# Patient Record
Sex: Female | Born: 1980 | Hispanic: No | Marital: Married | Smoking: Never smoker
Health system: Southern US, Community
[De-identification: ages and names within clinical notes are randomized; demographics above are authoritative.]

## PROBLEM LIST (undated history)

## (undated) DIAGNOSIS — A150 Tuberculosis of lung: Secondary | ICD-10-CM

## (undated) DIAGNOSIS — R51 Headache: Secondary | ICD-10-CM

## (undated) HISTORY — PX: NO PAST SURGERIES: SHX2092

---

## 2009-07-21 DIAGNOSIS — A150 Tuberculosis of lung: Secondary | ICD-10-CM

## 2009-07-21 HISTORY — DX: Tuberculosis of lung: A15.0

## 2009-08-08 LAB — SICKLE CELL SCREEN: Sickle Cell Screen: NORMAL

## 2009-08-08 LAB — OB RESULTS CONSOLE VARICELLA ZOSTER ANTIBODY, IGG: VARICELLA IGG: IMMUNE

## 2009-08-10 ENCOUNTER — Encounter: Admission: RE | Admit: 2009-08-10 | Discharge: 2009-08-10 | Payer: Self-pay | Admitting: Infectious Diseases

## 2009-08-16 ENCOUNTER — Ambulatory Visit: Payer: Self-pay | Admitting: Obstetrics & Gynecology

## 2009-09-10 LAB — TB SKIN TEST
Induration: 11 mm
TB Skin Test: POSITIVE

## 2009-10-24 ENCOUNTER — Ambulatory Visit: Payer: Self-pay | Admitting: Obstetrics and Gynecology

## 2010-01-10 ENCOUNTER — Ambulatory Visit: Payer: Self-pay | Admitting: Obstetrics and Gynecology

## 2010-03-29 ENCOUNTER — Ambulatory Visit: Payer: Self-pay | Admitting: Obstetrics & Gynecology

## 2010-06-17 ENCOUNTER — Ambulatory Visit: Payer: Self-pay | Admitting: Obstetrics & Gynecology

## 2010-09-02 ENCOUNTER — Ambulatory Visit: Payer: Self-pay

## 2010-09-30 ENCOUNTER — Inpatient Hospital Stay (INDEPENDENT_AMBULATORY_CARE_PROVIDER_SITE_OTHER)
Admission: RE | Admit: 2010-09-30 | Discharge: 2010-09-30 | Disposition: A | Discharge status: Home / Self Care | Payer: Medicaid Other | Source: Ambulatory Visit | Attending: Family Medicine | Admitting: Family Medicine

## 2010-09-30 DIAGNOSIS — J40 Bronchitis, not specified as acute or chronic: Secondary | ICD-10-CM

## 2011-05-12 ENCOUNTER — Inpatient Hospital Stay (INDEPENDENT_AMBULATORY_CARE_PROVIDER_SITE_OTHER)
Admission: RE | Admit: 2011-05-12 | Discharge: 2011-05-12 | Disposition: A | Discharge status: Home / Self Care | Payer: Self-pay | Source: Ambulatory Visit | Attending: Emergency Medicine | Admitting: Emergency Medicine

## 2011-05-12 DIAGNOSIS — K219 Gastro-esophageal reflux disease without esophagitis: Secondary | ICD-10-CM

## 2011-05-12 DIAGNOSIS — Z331 Pregnant state, incidental: Secondary | ICD-10-CM

## 2011-05-12 LAB — POCT URINALYSIS DIP (DEVICE)
Hgb urine dipstick: NEGATIVE
Ketones, ur: NEGATIVE mg/dL
Protein, ur: NEGATIVE mg/dL
Specific Gravity, Urine: 1.01 (ref 1.005–1.030)
Urobilinogen, UA: 0.2 mg/dL (ref 0.0–1.0)
pH: 7 (ref 5.0–8.0)

## 2011-05-23 ENCOUNTER — Emergency Department (HOSPITAL_COMMUNITY)
Admission: EM | Admit: 2011-05-23 | Discharge: 2011-05-24 | Disposition: A | Discharge status: Home / Self Care | Payer: Medicaid Other | Attending: Emergency Medicine | Admitting: Emergency Medicine

## 2011-05-23 ENCOUNTER — Emergency Department (HOSPITAL_COMMUNITY): Payer: Medicaid Other

## 2011-05-23 DIAGNOSIS — O99891 Other specified diseases and conditions complicating pregnancy: Secondary | ICD-10-CM | POA: Insufficient documentation

## 2011-05-23 DIAGNOSIS — R112 Nausea with vomiting, unspecified: Secondary | ICD-10-CM | POA: Insufficient documentation

## 2011-05-23 LAB — COMPREHENSIVE METABOLIC PANEL
Alkaline Phosphatase: 56 U/L (ref 39–117)
BUN: 13 mg/dL (ref 6–23)
Creatinine, Ser: 0.63 mg/dL (ref 0.50–1.10)
GFR calc Af Amer: >90 mL/min (ref >90)
Glucose, Bld: 86 mg/dL (ref 70–99)
Potassium: 3.6 mEq/L (ref 3.5–5.1)
Total Bilirubin: 0.2 mg/dL — ABNORMAL LOW (ref 0.3–1.2)
Total Protein: 7.3 g/dL (ref 6.0–8.3)

## 2011-05-23 LAB — URINE MICROSCOPIC-ADD ON

## 2011-05-23 LAB — DIFFERENTIAL
Basophils Absolute: 0 10*3/uL (ref 0.0–0.1)
Basophils Relative: 0 % (ref 0–1)
Neutro Abs: 6.9 10*3/uL (ref 1.7–7.7)
Neutrophils Relative %: 67 % (ref 43–77)

## 2011-05-23 LAB — POCT PREGNANCY, URINE: Preg Test, Ur: POSITIVE

## 2011-05-23 LAB — HCG, QUANTITATIVE, PREGNANCY: hCG, Beta Chain, Quant, S: 210022 m[IU]/mL — ABNORMAL HIGH (ref <5)

## 2011-05-23 LAB — CBC
HCT: 34.9 % — ABNORMAL LOW (ref 36.0–46.0)
Hemoglobin: 12.7 g/dL (ref 12.0–15.0)
MCHC: 36.4 g/dL — ABNORMAL HIGH (ref 30.0–36.0)
Platelets: 273 10*3/uL (ref 150–400)
RBC: 4.21 MIL/uL (ref 3.87–5.11)

## 2011-05-23 LAB — URINALYSIS, ROUTINE W REFLEX MICROSCOPIC
Glucose, UA: NEGATIVE mg/dL
Ketones, ur: NEGATIVE mg/dL
Nitrite: NEGATIVE
Protein, ur: NEGATIVE mg/dL
Urobilinogen, UA: 0.2 mg/dL (ref 0.0–1.0)

## 2011-05-24 ENCOUNTER — Emergency Department (HOSPITAL_COMMUNITY)
Admission: EM | Admit: 2011-05-24 | Discharge: 2011-05-24 | Disposition: A | Discharge status: Home / Self Care | Payer: Medicaid Other | Attending: Emergency Medicine | Admitting: Emergency Medicine

## 2011-05-24 DIAGNOSIS — R1115 Cyclical vomiting syndrome unrelated to migraine: Secondary | ICD-10-CM | POA: Insufficient documentation

## 2011-05-24 DIAGNOSIS — O9989 Other specified diseases and conditions complicating pregnancy, childbirth and the puerperium: Secondary | ICD-10-CM | POA: Insufficient documentation

## 2011-05-24 LAB — URINALYSIS, ROUTINE W REFLEX MICROSCOPIC
Bilirubin Urine: NEGATIVE
Leukocytes, UA: NEGATIVE
Protein, ur: NEGATIVE mg/dL
Specific Gravity, Urine: 1.014 (ref 1.005–1.030)

## 2011-05-24 LAB — POCT I-STAT, CHEM 8
Creatinine, Ser: 0.7 mg/dL (ref 0.50–1.10)
HCT: 37 % (ref 36.0–46.0)
Hemoglobin: 12.6 g/dL (ref 12.0–15.0)
Potassium: 3.7 mEq/L (ref 3.5–5.1)
Sodium: 139 mEq/L (ref 135–145)
TCO2: 22 mmol/L (ref 0–100)

## 2011-05-24 LAB — WET PREP, GENITAL
Clue Cells Wet Prep HPF POC: NONE SEEN
Trich, Wet Prep: NONE SEEN

## 2011-05-24 LAB — GC/CHLAMYDIA PROBE AMP, GENITAL: Chlamydia, DNA Probe: NEGATIVE

## 2011-06-02 ENCOUNTER — Encounter: Payer: Self-pay | Admitting: *Deleted

## 2011-06-02 ENCOUNTER — Emergency Department (INDEPENDENT_AMBULATORY_CARE_PROVIDER_SITE_OTHER)
Admission: EM | Admit: 2011-06-02 | Discharge: 2011-06-02 | Disposition: A | Discharge status: Home / Self Care | Payer: Medicaid Other | Source: Home / Self Care

## 2011-06-02 DIAGNOSIS — IMO0002 Reserved for concepts with insufficient information to code with codable children: Secondary | ICD-10-CM

## 2011-06-02 DIAGNOSIS — O219 Vomiting of pregnancy, unspecified: Secondary | ICD-10-CM

## 2011-06-02 DIAGNOSIS — K5900 Constipation, unspecified: Secondary | ICD-10-CM

## 2011-06-02 HISTORY — DX: Tuberculosis of lung: A15.0

## 2011-06-02 LAB — POCT URINALYSIS DIP (DEVICE)
Protein, ur: NEGATIVE mg/dL
Specific Gravity, Urine: >=1.03 (ref 1.005–1.030)
Urobilinogen, UA: 0.2 mg/dL (ref 0.0–1.0)
pH: 5.5 (ref 5.0–8.0)

## 2011-06-02 MED ORDER — ONDANSETRON 4 MG PO TBDP
8.0000 mg | ORAL_TABLET | Freq: Once | ORAL | Status: AC
  Administered 2011-06-02: 8 mg via ORAL

## 2011-06-02 NOTE — ED Notes (Signed)
Pt is here with husband and interpreter.  Reports pt is pregnant.  Pt was seen in [facility] ED 11/3 and diagnosed with nausea and vomiting and given Rx for zofran.  Pt continues to report nausea (without vomiting), HA, and epigastric burning.

## 2011-06-02 NOTE — ED Provider Notes (Signed)
History     CSN: [id number] Arrival date & time: 06/02/2011  7:37 PM   First MD Initiated Contact with Patient 06/02/11 1959      Chief Complaint  Patient presents with  . Morning Sickness    (Consider location/radiation/quality/duration/timing/severity/associated sxs/prior treatment) Patient is a 30 y.o. female presenting with GI illlness. The history is provided by the patient and the spouse. The history is limited by a language barrier. A language interpreter was used (brought own interpreter).  GI Problem  This is a new (over 1 week) problem. Episode onset: Reports 1 month pregnancy but can not give a LMP has been experiencing nausea with vomitting last week, during last weekonly nausea but no vomitting has been seen twice this week for same complaint  has zofran prescritption at home but no taking. There has been no fever. Pertinent negatives include no abdominal pain, no vomiting, no chills, no sweats, no headaches, no arthralgias, no myalgias, no URI and no cough. Associated symptoms comments: Constipation, last bowel movement 5 days ago passing gas, denies abdominal pain. No vomiting but feeling sick in her stomach. No dysuria, no vaginal bleeding or discharge.. Treatments tried: had a prescripton for zofran but no taking as husband thinks it makes her feel weak. No stablished OB yet.    Past Medical History  Diagnosis Date  . TB (pulmonary tuberculosis)     Pt was treated for 8 months    History reviewed. No pertinent past surgical history.  History reviewed. No pertinent family history.  History  Substance Use Topics  . Smoking status: Never Smoker   . Smokeless tobacco: Not on file  . Alcohol Use: No    OB History    Grav Para Term Preterm Abortions TAB SAB Ect Mult Living   7 6              Review of Systems  Constitutional: Negative for chills.  Respiratory: Negative for cough.   Gastrointestinal: Negative for vomiting and abdominal pain.  Genitourinary:  Negative for dysuria, hematuria, flank pain, vaginal bleeding, vaginal discharge, difficulty urinating and pelvic pain.  Musculoskeletal: Negative for myalgias and arthralgias.  Neurological: Negative for headaches.    Allergies  Review of patient's allergies indicates no known allergies.  Home Medications   Current Outpatient Rx  Name Route Sig Dispense Refill  . PRENATAL 27-0.8 MG PO TABS Oral Take 1 tablet by mouth daily.        BP 100/68  Pulse 68  Temp(Src) 98.7 F (37.1 C) (Oral)  Resp 14  SpO2 100%  LMP 05/02/2011  Physical Exam  Constitutional: She is oriented to person, place, and time. She appears well-developed and well-nourished.       Spitting frequently.  HENT:  Mouth/Throat: Oropharynx is clear and moist.       Moist mucus membranes  Eyes: Conjunctivae are normal. Pupils are equal, round, and reactive to light. No scleral icterus.  Cardiovascular: Normal rate, regular rhythm and normal heart sounds.        No tachycardia  Pulmonary/Chest: Effort normal and breath sounds normal.  Abdominal: Soft. Bowel sounds are normal. She exhibits no distension and no mass. There is no tenderness. There is no rebound and no guarding.       Thin flat abdomen, Uterus fundus not palpable  Lymphadenopathy:    She has no cervical adenopathy.  Neurological: She is alert and oriented to person, place, and time.  Skin: No rash noted. No pallor.  Normal cap refill.    ED Course  Procedures (including critical care time)  Labs Reviewed  POCT URINALYSIS DIP (DEVICE) - Abnormal; Notable for the following:    Hgb urine dipstick TRACE (*)    All other components within normal limits  POCT PREGNANCY, URINE  LAB REPORT - SCANNED   No results found.   1. Constipation in pregnancy   2. Nausea and vomiting in pregnancy       MDM  No clinical signs of dehydration, treated symptomatically and counseled for nausea and constipation during pregnancy.        Sharin Grave, MD 06/03/11 1031

## 2011-06-11 LAB — OB RESULTS CONSOLE HEPATITIS B SURFACE ANTIGEN
Hepatitis B Surface Ag: NEGATIVE
Hepatitis B Surface Ag: POSITIVE

## 2011-06-11 LAB — OB RESULTS CONSOLE ABO/RH: RH Type: POSITIVE

## 2011-06-11 LAB — OB RESULTS CONSOLE HIV ANTIBODY (ROUTINE TESTING): HIV: NONREACTIVE

## 2011-06-20 ENCOUNTER — Encounter (HOSPITAL_COMMUNITY): Payer: Self-pay | Admitting: *Deleted

## 2011-06-20 ENCOUNTER — Emergency Department (HOSPITAL_COMMUNITY)
Admission: EM | Admit: 2011-06-20 | Discharge: 2011-06-21 | Disposition: A | Discharge status: Home / Self Care | Payer: Medicaid Other | Attending: Emergency Medicine | Admitting: Emergency Medicine

## 2011-06-20 DIAGNOSIS — Z8611 Personal history of tuberculosis: Secondary | ICD-10-CM | POA: Insufficient documentation

## 2011-06-20 DIAGNOSIS — Z331 Pregnant state, incidental: Secondary | ICD-10-CM | POA: Insufficient documentation

## 2011-06-20 DIAGNOSIS — R1031 Right lower quadrant pain: Secondary | ICD-10-CM | POA: Insufficient documentation

## 2011-06-20 DIAGNOSIS — K921 Melena: Secondary | ICD-10-CM | POA: Insufficient documentation

## 2011-06-20 LAB — DIFFERENTIAL
Basophils Relative: 0 % (ref 0–1)
Lymphocytes Relative: 19 % (ref 12–46)
Lymphs Abs: 1.3 10*3/uL (ref 0.7–4.0)
Monocytes Relative: 7 % (ref 3–12)
Neutro Abs: 5.2 10*3/uL (ref 1.7–7.7)
Neutrophils Relative %: 73 % (ref 43–77)

## 2011-06-20 LAB — URINALYSIS, ROUTINE W REFLEX MICROSCOPIC
Bilirubin Urine: NEGATIVE
Leukocytes, UA: NEGATIVE
Nitrite: NEGATIVE
Specific Gravity, Urine: 1.005 (ref 1.005–1.030)
pH: 7.5 (ref 5.0–8.0)

## 2011-06-20 LAB — CBC
Hemoglobin: 15.1 g/dL — ABNORMAL HIGH (ref 12.0–15.0)
RBC: 5.04 MIL/uL (ref 3.87–5.11)

## 2011-06-20 MED ORDER — ACETAMINOPHEN 325 MG PO TABS
650.0000 mg | ORAL_TABLET | Freq: Once | ORAL | Status: AC
  Administered 2011-06-20: 650 mg via ORAL
  Filled 2011-06-20: qty 2

## 2011-06-20 NOTE — ED Notes (Signed)
Pt pointing to head, per husband states she has headache. Remains on cardiac monitor. Vital signs stable.

## 2011-06-20 NOTE — ED Notes (Signed)
Pt reports being currently pregnant, pt states, "I am nearly 2 mths pregnant. This is my 7th pregnancy. All of my children were full term & natural birth. I am taking maternity vitamins. My doc

## 2011-06-20 NOTE — ED Notes (Signed)
Pt presents to department via [facility] for evaluation of lethargy, failure to thrive, and dizziness. Family member states she hasn't eaten in over x1 month. Pt is difficult to arouse and lethargic upon arrival.

## 2011-06-21 LAB — BASIC METABOLIC PANEL
BUN: 8 mg/dL (ref 6–23)
Chloride: 103 mEq/L (ref 96–112)
GFR calc Af Amer: >90 mL/min (ref >90)
Glucose, Bld: 100 mg/dL — ABNORMAL HIGH (ref 70–99)
Potassium: 3.3 mEq/L — ABNORMAL LOW (ref 3.5–5.1)

## 2011-06-21 NOTE — ED Notes (Signed)
Translator used to discuss discharge instructions,  Both pt & her husband verbalized understanding of discharge instructions

## 2011-06-21 NOTE — ED Provider Notes (Addendum)
History     CSN: [id number] Arrival date & time: 06/20/2011  8:06 PM   First MD Initiated Contact with Patient 06/20/11 2010      Chief Complaint  Patient presents with  . Fatigue    Failure To Thrive    (Consider location/radiation/quality/duration/timing/severity/associated sxs/prior treatment) The history is provided by the patient and medical records. The history is limited by a language barrier. A language interpreter was used.   the patient is a 30 year old, female who is G6 P7.  She presents to the emergency department complaining of fatigue and right lower abdominal pain.  She has had black stools.  She has not had urinary tract symptoms.  She has not had any vaginal discharge or bleeding.  She was in the emergency department on November 2 and she had an ultrasound that showed an intrauterine pregnancy, with a single gestation.  Past Medical History  Diagnosis Date  . TB (pulmonary tuberculosis)     Pt was treated for 8 months    History reviewed. No pertinent past surgical history.  No family history on file.  History  Substance Use Topics  . Smoking status: Never Smoker   . Smokeless tobacco: Not on file  . Alcohol Use: No    OB History    Grav Para Term Preterm Abortions TAB SAB Ect Mult Living   7 6              Review of Systems  Unable to perform ROS   Allergies  Review of patient's allergies indicates no known allergies.  Home Medications   Current Outpatient Rx  Name Route Sig Dispense Refill  . RANITIDINE HCL 150 MG PO TABS Oral Take 150 mg by mouth 2 (two) times daily.        BP 99/57  Pulse 72  Temp(Src) 98.7 F (37.1 C) (Oral)  Resp 18  SpO2 99%  LMP 05/02/2011  Physical Exam  Constitutional: She is oriented to person, place, and time. She appears well-developed and well-nourished.  HENT:  Head: Normocephalic and atraumatic.  Eyes: Pupils are equal, round, and reactive to light.  Neck: Normal range of motion.  Cardiovascular:  Normal rate, regular rhythm and normal heart sounds.   No murmur heard. Pulmonary/Chest: Effort normal and breath sounds normal. No respiratory distress. She has no wheezes. She has no rales.  Abdominal: Soft. She exhibits no distension and no mass. There is tenderness. There is no rebound and no guarding.       Mild right lower quadrant tenderness, with no peritoneal sign  Musculoskeletal: Normal range of motion. She exhibits no edema and no tenderness.  Neurological: She is alert and oriented to person, place, and time. No cranial nerve deficit.  Skin: Skin is warm and dry. No rash noted. No erythema.  Psychiatric: She has a normal mood and affect. Her behavior is normal.    ED Course  Procedures (including critical care time)  30 year old, female, complaining of lower abdominal pain in the setting of intrauterine pregnancy.  She has no peritoneal signs.  She is nontoxic.  We will perform a urinalysis, and blood chemistries for evaluation.  I will hydrate her with IV fluids while she is in the emergency department.  Labs Reviewed  CBC - Abnormal; Notable for the following:    Hemoglobin 15.1 (*)    MCHC 36.3 (*)    All other components within normal limits  DIFFERENTIAL  URINALYSIS, ROUTINE W REFLEX MICROSCOPIC  BASIC METABOLIC PANEL   No  results found.   No diagnosis found.    MDM  Intrauterine pregnancy.  No signs of complications        Nicholes Stairs, MD 06/21/11 0007  Nicholes Stairs, MD 06/21/11 [phone]

## 2011-07-22 ENCOUNTER — Inpatient Hospital Stay (HOSPITAL_COMMUNITY): Admission: AD | Admit: 2011-07-22 | Payer: Self-pay | Source: Ambulatory Visit | Admitting: Obstetrics and Gynecology

## 2011-12-29 ENCOUNTER — Other Ambulatory Visit (HOSPITAL_COMMUNITY): Payer: Self-pay | Admitting: Obstetrics and Gynecology

## 2011-12-29 ENCOUNTER — Ambulatory Visit (HOSPITAL_COMMUNITY)
Admission: RE | Admit: 2011-12-29 | Discharge: 2011-12-29 | Disposition: A | Discharge status: Home / Self Care | Payer: Medicaid Other | Source: Ambulatory Visit | Attending: Obstetrics and Gynecology | Admitting: Obstetrics and Gynecology

## 2011-12-29 DIAGNOSIS — O288 Other abnormal findings on antenatal screening of mother: Secondary | ICD-10-CM

## 2011-12-29 DIAGNOSIS — O4290 Premature rupture of membranes, unspecified as to length of time between rupture and onset of labor, unspecified weeks of gestation: Secondary | ICD-10-CM | POA: Insufficient documentation

## 2011-12-29 DIAGNOSIS — Z3689 Encounter for other specified antenatal screening: Secondary | ICD-10-CM | POA: Insufficient documentation

## 2011-12-30 ENCOUNTER — Inpatient Hospital Stay (HOSPITAL_COMMUNITY)
Admission: AD | Admit: 2011-12-30 | Discharge: 2011-12-31 | DRG: 775 | Disposition: A | Discharge status: Home / Self Care | Payer: Medicaid Other | Source: Ambulatory Visit | Attending: Obstetrics and Gynecology | Admitting: Obstetrics and Gynecology

## 2011-12-30 ENCOUNTER — Encounter (HOSPITAL_COMMUNITY): Payer: Self-pay | Admitting: *Deleted

## 2011-12-30 HISTORY — DX: Headache: R51

## 2011-12-30 LAB — CBC
Hemoglobin: 7.7 g/dL — ABNORMAL LOW (ref 12.0–15.0)
MCH: 27 pg (ref 26.0–34.0)
MCH: 26.8 pg (ref 26.0–34.0)
Platelets: 317 10*3/uL (ref 150–400)
RBC: 3.85 MIL/uL — ABNORMAL LOW (ref 3.87–5.11)
RBC: 2.87 MIL/uL — ABNORMAL LOW (ref 3.87–5.11)
WBC: 13.6 10*3/uL — ABNORMAL HIGH (ref 4.0–10.5)
WBC: 14.8 10*3/uL — ABNORMAL HIGH (ref 4.0–10.5)

## 2011-12-30 LAB — RPR: RPR Ser Ql: NONREACTIVE

## 2011-12-30 MED ORDER — ZOLPIDEM TARTRATE 5 MG PO TABS: 5.0000 mg | ORAL_TABLET | Freq: Every evening | ORAL | Status: DC | PRN

## 2011-12-30 MED ORDER — OXYCODONE-ACETAMINOPHEN 5-325 MG PO TABS: 1.0000 | ORAL_TABLET | ORAL | Status: DC | PRN

## 2011-12-30 MED ORDER — ONDANSETRON HCL 4 MG/2ML IJ SOLN: 4.0000 mg | INTRAMUSCULAR | Status: DC | PRN

## 2011-12-30 MED ORDER — SIMETHICONE 80 MG PO CHEW: 80.0000 mg | CHEWABLE_TABLET | ORAL | Status: DC | PRN

## 2011-12-30 MED ORDER — OXYCODONE-ACETAMINOPHEN 5-325 MG PO TABS
1.0000 | ORAL_TABLET | ORAL | Status: DC | PRN
  Administered 2011-12-30: 1 via ORAL
  Filled 2011-12-30: qty 1

## 2011-12-30 MED ORDER — ONDANSETRON HCL 4 MG/2ML IJ SOLN: 4.0000 mg | Freq: Four times a day (QID) | INTRAMUSCULAR | Status: DC | PRN

## 2011-12-30 MED ORDER — ACETAMINOPHEN 325 MG PO TABS: 650.0000 mg | ORAL_TABLET | ORAL | Status: DC | PRN

## 2011-12-30 MED ORDER — TETANUS-DIPHTH-ACELL PERTUSSIS 5-2.5-18.5 LF-MCG/0.5 IM SUSP
0.5000 mL | Freq: Once | INTRAMUSCULAR | Status: AC
  Administered 2011-12-31: 0.5 mL via INTRAMUSCULAR

## 2011-12-30 MED ORDER — FAMOTIDINE 20 MG PO TABS
20.0000 mg | ORAL_TABLET | Freq: Two times a day (BID) | ORAL | Status: DC
  Administered 2011-12-30 – 2011-12-31 (×3): 20 mg via ORAL
  Filled 2011-12-30 (×3): qty 1

## 2011-12-30 MED ORDER — DIBUCAINE 1 % RE OINT: 1.0000 "application " | TOPICAL_OINTMENT | RECTAL | Status: DC | PRN

## 2011-12-30 MED ORDER — BUTORPHANOL TARTRATE 2 MG/ML IJ SOLN: 1.0000 mg | INTRAMUSCULAR | Status: DC | PRN

## 2011-12-30 MED ORDER — LACTATED RINGERS IV SOLN
INTRAVENOUS | Status: DC
  Administered 2011-12-30: 09:00:00 via INTRAVENOUS

## 2011-12-30 MED ORDER — MEASLES, MUMPS & RUBELLA VAC ~~LOC~~ INJ: 0.5000 mL | INJECTION | Freq: Once | SUBCUTANEOUS | Status: DC

## 2011-12-30 MED ORDER — BENZOCAINE-MENTHOL 20-0.5 % EX AERO: 1.0000 "application " | INHALATION_SPRAY | CUTANEOUS | Status: DC | PRN

## 2011-12-30 MED ORDER — LIDOCAINE HCL (PF) 1 % IJ SOLN: 30.0000 mL | INTRAMUSCULAR | Status: DC | PRN

## 2011-12-30 MED ORDER — WITCH HAZEL-GLYCERIN EX PADS: 1.0000 "application " | MEDICATED_PAD | CUTANEOUS | Status: DC | PRN

## 2011-12-30 MED ORDER — PRENATAL MULTIVITAMIN CH
1.0000 | ORAL_TABLET | Freq: Every day | ORAL | Status: DC
  Administered 2011-12-30 – 2011-12-31 (×2): 1 via ORAL
  Filled 2011-12-30 (×2): qty 1

## 2011-12-30 MED ORDER — IBUPROFEN 600 MG PO TABS
600.0000 mg | ORAL_TABLET | Freq: Four times a day (QID) | ORAL | Status: DC
  Administered 2011-12-30 – 2011-12-31 (×4): 600 mg via ORAL
  Filled 2011-12-30 (×5): qty 1

## 2011-12-30 MED ORDER — IBUPROFEN 600 MG PO TABS
600.0000 mg | ORAL_TABLET | Freq: Four times a day (QID) | ORAL | Status: DC | PRN
  Administered 2011-12-30: 600 mg via ORAL
  Filled 2011-12-30: qty 1

## 2011-12-30 MED ORDER — FLEET ENEMA 7-19 GM/118ML RE ENEM: 1.0000 | ENEMA | RECTAL | Status: DC | PRN

## 2011-12-30 MED ORDER — OXYTOCIN 20 UNITS IN LACTATED RINGERS INFUSION - SIMPLE
125.0000 mL/h | Freq: Once | INTRAVENOUS | Status: AC
  Administered 2011-12-30: 125 mL/h via INTRAVENOUS

## 2011-12-30 MED ORDER — DIPHENHYDRAMINE HCL 25 MG PO CAPS: 25.0000 mg | ORAL_CAPSULE | Freq: Four times a day (QID) | ORAL | Status: DC | PRN

## 2011-12-30 MED ORDER — LANOLIN HYDROUS EX OINT: TOPICAL_OINTMENT | CUTANEOUS | Status: DC | PRN

## 2011-12-30 MED ORDER — CITRIC ACID-SODIUM CITRATE 334-500 MG/5ML PO SOLN: 30.0000 mL | ORAL | Status: DC | PRN

## 2011-12-30 MED ORDER — FAMOTIDINE IN NACL 20-0.9 MG/50ML-% IV SOLN: 20.0000 mg | Freq: Two times a day (BID) | INTRAVENOUS | Status: DC

## 2011-12-30 MED ORDER — LACTATED RINGERS IV SOLN: 500.0000 mL | INTRAVENOUS | Status: DC | PRN

## 2011-12-30 MED ORDER — OXYTOCIN 20 UNITS IN LACTATED RINGERS INFUSION - SIMPLE: 125.0000 mL/h | INTRAVENOUS | Status: AC

## 2011-12-30 MED ORDER — OXYTOCIN BOLUS FROM INFUSION
500.0000 mL | Freq: Once | INTRAVENOUS | Status: AC
  Administered 2011-12-30: 500 mL via INTRAVENOUS
  Filled 2011-12-30: qty 1000
  Filled 2011-12-30: qty 500
  Filled 2011-12-30: qty 1000

## 2011-12-30 MED ORDER — SENNOSIDES-DOCUSATE SODIUM 8.6-50 MG PO TABS
2.0000 | ORAL_TABLET | Freq: Every day | ORAL | Status: DC
  Administered 2011-12-30: 2 via ORAL

## 2011-12-30 MED ORDER — ONDANSETRON HCL 4 MG PO TABS: 4.0000 mg | ORAL_TABLET | ORAL | Status: DC | PRN

## 2011-12-30 NOTE — H&P (Signed)
NAME:  Katherine Roman, Katherine Roman                      ACCOUNT NO.:  [id number]  MEDICAL RECORD NO.:  [id number]  LOCATION:  9166                          FACILITY:  [facility]  PHYSICIAN:  Willona Phariss. Ambrose Mantle, M.D. DATE OF BIRTH:  09/11/1980  DATE OF ADMISSION:  12/30/2011 DATE OF DISCHARGE:                             HISTORY & PHYSICAL   HISTORY OF PRESENT ILLNESS:  This is a 31 year old Burmese female, para 6-0-0-6, gravida 7, with [facility] January 05, 2012 who was admitted in active labor at 7 cm dilatation.  Blood group and type A positive, negative antibody, negative Pap.  Urine culture was positive to E. coli and was treated with Keflex early in pregnancy.  Rubella immune, RPR nonreactive, hepatitis B surface antigen negative, HIV negative, GC and Chlamydia negative.  First trimester screen, possibly was done but the results are not on the chart.  One hour Glucola was elevated.  The 3- hour GTT had one abnormal value.  Fasting was 81.  One -hour Glucola was 216, 2-hour 120, and 3-hour 130.  Group B strep was negative.  After admission to the hospital, I examined the patient and she was 9 cm.  ALLERGIES:  No known allergies.  PAST MEDICAL HISTORY:  She did have ulcers in 2012.  PAST SURGICAL HISTORY:  She has no history of surgeries.  FAMILY HISTORY:  There is no family history.  OBSTETRIC HISTORY:  She has had 6 vaginal deliveries in [place], 3 males and 3 females.  PERSONAL HISTORY:  She denies tobacco, alcohol, and illicit substance abuse.  She lives with her husband and children.  PHYSICAL EXAMINATION:  VITAL SIGNS:  Blood pressure 118/75, temperature 98.9, pulse 92, respirations 20. HEART:  Normal size and sounds.  No murmurs. LUNGS:  Clear to auscultation. ABDOMEN:  Soft.  At her last prenatal visit, her fundal height was 38 cm on December 29, 2011.  Cervix now is 9 cm, 100%, bulging membranes, vertex, 0 station.  ADMITTING IMPRESSION:  Intrauterine pregnancy at 39+ weeks, advanced labor.  The patient  is prepared for delivery.    Malachi Pro. Ambrose Mantle, M.D.    TFH/MEDQ  D:  12/30/2011  T:  12/30/2011  Job:  [id number]

## 2011-12-30 NOTE — Progress Notes (Signed)
Patient ID: Katherine Roman, female   DOB: August 06, 1980, 31 y.o.   MRN: [id number] Delivery note:   The pt was admitted and became fully dilated. Amniotomy produced clear fluid. There was no interpreter except a small child who seemed to be unable or unwilling to communicate my thoughts to her mother. The pt pushed poorly and after the head slowly delivered we encountered significant shoulder dystocia. We tried Lysle Dingwall, woodscrew and suprapubic pressure without benefit so I delivered the posterior shoulder and then the anterior shoulder. The dystocia lasted 30 seconds but the NICU team had been called so they evaluated the baby. The infant was female 6 pounds 12 ounces and Apgars were 8 and 9 at 1 and 5 minutes. The placenta was intact and the uterus was normal. EBL 400 cc's. There were no lacerations. The left shoulder was the posterior shoulder.

## 2011-12-30 NOTE — Progress Notes (Signed)
Patient ID: Katherine Roman, female   DOB: July 26, 1980, 31 y.o.   MRN: [id number] I was called by the MB nurse shortly after the pt arrived on the floor and was told that she had expressed a grapefruit sized clot from the vagina but that the bleeding had stopped with pitocin and massage. I called back later and was informed that the pt was doing well.Now, the uterus feels firm and there is no excessive bleeding.

## 2011-12-30 NOTE — MAU Note (Signed)
Ctx's started this morning.  Getting stronger.  Both front and back.

## 2011-12-30 NOTE — MAU Note (Signed)
family member states pain started last night getting stronger. ? Leaking, no bleeding.

## 2011-12-31 LAB — CBC
HCT: 24.4 % — ABNORMAL LOW (ref 36.0–46.0)
RDW: 13.7 % (ref 11.5–15.5)
WBC: 13.6 10*3/uL — ABNORMAL HIGH (ref 4.0–10.5)

## 2011-12-31 MED ORDER — IBUPROFEN 600 MG PO TABS: 600.0000 mg | ORAL_TABLET | Freq: Four times a day (QID) | ORAL | Status: AC

## 2011-12-31 NOTE — Progress Notes (Signed)
Wants to go home Appears stable, will d/c home

## 2011-12-31 NOTE — Discharge Instructions (Signed)
As per discharge pamphlet °

## 2011-12-31 NOTE — Progress Notes (Signed)
UR chart review completed.  

## 2011-12-31 NOTE — Progress Notes (Signed)
Patient ID: Katherine Roman, female   DOB: 12-31-1980, 31 y.o.   MRN: [id number] #1 afebrile doing well no complaints. Last night pt c/o feeling dizzy. CBC was OK and sx improved. Pt denies dizziness now. 2 daughters are translating HGB 10.3 to 7.7 and 7.8

## 2011-12-31 NOTE — Progress Notes (Signed)
Patient was referred for history of depression/anxiety.  * Referral screened out by Clinical Social Worker because none of the following criteria appear to apply:  ~ History of anxiety/depression during this pregnancy, or of post-partum depression.  ~ Diagnosis of anxiety and/or depression within last 3 years  ~ History of depression due to pregnancy loss/loss of child  OR  * Patient's symptoms currently being treated with medication and/or therapy.  Please contact the Clinical Social Worker if needs arise, or by the patient's request.  Pt is currently seeing a counselor who comes to her home. Counseling services are helpful and pt does not request any Sw intervention at this time. Sw spoke with pt via pacific interpreter.      

## 2011-12-31 NOTE — Discharge Summary (Signed)
Obstetric Discharge Summary Reason for Admission: onset of labor Prenatal Procedures: none Intrapartum Procedures: spontaneous vaginal delivery and shoulder dystocia Postpartum Procedures: none Complications-Operative and Postpartum: none Hemoglobin  Date Value Range Status  12/31/2011 7.8* 12.0 - 15.0 g/dL Final     HCT  Date Value Range Status  12/31/2011 24.4* 36.0 - 46.0 % Final    Physical Exam:  General: alert Lochia: appropriate Uterine Fundus: firm  Discharge Diagnoses: Term Pregnancy-delivered and shoulder dystocia  Discharge Information: Date: 12/31/2011 Activity: pelvic rest Diet: routine Medications: Ibuprofen Condition: stable Instructions: refer to practice specific booklet Discharge to: home Follow-up Information    Follow up with Katherine Plume, MD. Schedule an appointment as soon as possible for a visit in 6 weeks.   Contact information:   [employer] [address] [place], [address] [place] [address] [phone]          Newborn Data: Live born female  Birth Weight: 6 lb 12.3 oz (3070 g) APGAR: 8, 9  Home with mother.  Katherine Roman 12/31/2011, 5:19 PM

## 2012-07-21 NOTE — L&D Delivery Note (Signed)
Delivery Note At 5:05 AM a viable female was delivered via Vaginal, Spontaneous Delivery (Presentation: Occiput, Anterior). Infant placed directly on mom's chest. APGAR: 9, 9; weight pending.   Placenta status: Intact, Spontaneous.  Cord: 3 vessels with the following complications: None.  Cord pH: not sent. Sibling present for birth.  Anesthesia: None  Episiotomy: None Lacerations: None Suture Repair: N/A Est. Blood Loss (mL): 200  Mom to postpartum.  Baby to Couplet care / Skin to Skin.  Ciin, Brazzel 05/29/2013, 5:31 AM  This patient presented to MAU in active labor.  It was determined she had not had prenatal care, though she saw Dr Ambrose Mantle in 2013.  I attended delivery and agree with note.   [name], CNM

## 2012-09-23 ENCOUNTER — Ambulatory Visit: Payer: Medicaid Other | Admitting: Medical

## 2013-02-12 ENCOUNTER — Emergency Department (HOSPITAL_COMMUNITY)
Admission: EM | Admit: 2013-02-12 | Discharge: 2013-02-13 | Disposition: A | Discharge status: Home / Self Care | Payer: Medicaid Other | Attending: Emergency Medicine | Admitting: Emergency Medicine

## 2013-02-12 ENCOUNTER — Encounter (HOSPITAL_COMMUNITY): Payer: Self-pay | Admitting: Emergency Medicine

## 2013-02-12 DIAGNOSIS — Y939 Activity, unspecified: Secondary | ICD-10-CM | POA: Insufficient documentation

## 2013-02-12 DIAGNOSIS — W19XXXA Unspecified fall, initial encounter: Secondary | ICD-10-CM

## 2013-02-12 DIAGNOSIS — Z79899 Other long term (current) drug therapy: Secondary | ICD-10-CM | POA: Insufficient documentation

## 2013-02-12 DIAGNOSIS — Z3490 Encounter for supervision of normal pregnancy, unspecified, unspecified trimester: Secondary | ICD-10-CM

## 2013-02-12 DIAGNOSIS — Z8611 Personal history of tuberculosis: Secondary | ICD-10-CM | POA: Insufficient documentation

## 2013-02-12 DIAGNOSIS — K219 Gastro-esophageal reflux disease without esophagitis: Secondary | ICD-10-CM | POA: Insufficient documentation

## 2013-02-12 DIAGNOSIS — W010XXA Fall on same level from slipping, tripping and stumbling without subsequent striking against object, initial encounter: Secondary | ICD-10-CM | POA: Insufficient documentation

## 2013-02-12 DIAGNOSIS — Z8669 Personal history of other diseases of the nervous system and sense organs: Secondary | ICD-10-CM | POA: Insufficient documentation

## 2013-02-12 DIAGNOSIS — O9989 Other specified diseases and conditions complicating pregnancy, childbirth and the puerperium: Secondary | ICD-10-CM | POA: Insufficient documentation

## 2013-02-12 DIAGNOSIS — Y929 Unspecified place or not applicable: Secondary | ICD-10-CM | POA: Insufficient documentation

## 2013-02-12 LAB — COMPREHENSIVE METABOLIC PANEL
CO2: 22 mEq/L (ref 19–32)
Calcium: 8.5 mg/dL (ref 8.4–10.5)
Chloride: 101 mEq/L (ref 96–112)
Creatinine, Ser: 0.51 mg/dL (ref 0.50–1.10)
GFR calc Af Amer: >90 mL/min (ref >90)
GFR calc non Af Amer: >90 mL/min (ref >90)
Glucose, Bld: 106 mg/dL — ABNORMAL HIGH (ref 70–99)
Total Bilirubin: 0.1 mg/dL — ABNORMAL LOW (ref 0.3–1.2)

## 2013-02-12 LAB — CBC WITH DIFFERENTIAL/PLATELET
Eosinophils Relative: 2 % (ref 0–5)
HCT: 30.7 % — ABNORMAL LOW (ref 36.0–46.0)
Hemoglobin: 10.7 g/dL — ABNORMAL LOW (ref 12.0–15.0)
Lymphocytes Relative: 19 % (ref 12–46)
Lymphs Abs: 2.3 10*3/uL (ref 0.7–4.0)
MCV: 85.3 fL (ref 78.0–100.0)
Monocytes Absolute: 1 10*3/uL (ref 0.1–1.0)
Monocytes Relative: 8 % (ref 3–12)
RBC: 3.6 MIL/uL — ABNORMAL LOW (ref 3.87–5.11)
WBC: 12.6 10*3/uL — ABNORMAL HIGH (ref 4.0–10.5)

## 2013-02-12 NOTE — ED Provider Notes (Signed)
CSN: [id number]     Arrival date & time 02/12/13  2231 History     First MD Initiated Contact with Patient 02/12/13 2308     Chief Complaint  Patient presents with  . Fall  . Dizziness   HPI Eathel Siemon is a 32 y.o. female who tripped, and fell onto her right side, she did not strike her abdomen. Patient is currently pregnant at 25 and 6 by last menstrual period - she currently does not have prenatal care, she is following with the health department for her prenatal services. She has no pain, she denies rush of fluid, no nausea vomiting, no diarrhea, no vaginal bleeding.  Patient has no chest pains, no shortness of breath, fevers or chills. Rapid response OB nurse at the bedside   Past Medical History  Diagnosis Date  . TB (pulmonary tuberculosis)     Pt was treated for 8 months  . JYNWGNFA(213.0)    Past Surgical History  Procedure Laterality Date  . No past surgeries     Family History  Problem Relation Age of Onset  . Anesthesia problems Neg Hx    History  Substance Use Topics  . Smoking status: Never Smoker   . Smokeless tobacco: Never Used  . Alcohol Use: No   OB History   Grav Para Term Preterm Abortions TAB SAB Ect Mult Living   8 7 1   0     7     Review of Systems At least 10pt or greater review of systems completed and are negative except where specified in the HPI.  Allergies  Review of patient's allergies indicates no known allergies.  Home Medications   Current Outpatient Rx  Name  Route  Sig  Dispense  Refill  . Prenatal Vit-Fe Fumarate-FA (PRENATAL MULTIVITAMIN) TABS   Oral   Take 1 tablet by mouth daily at 12 noon.          BP 103/60  Pulse 74  Temp(Src) 98.2 F (36.8 C) (Oral)  Resp 14  SpO2 99% Physical Exam  Nursing notes reviewed.  Electronic medical record reviewed. VITAL SIGNS:   Filed Vitals:   02/12/13 2241  BP: 103/60  Pulse: 74  Temp: 98.2 F (36.8 C)  TempSrc: Oral  Resp: 14  SpO2: 99%   CONSTITUTIONAL: Awake, oriented,  appears non-toxic HENT: Atraumatic, normocephalic, oral mucosa pink and moist, airway patent. Nares patent without drainage. External ears normal. EYES: Conjunctiva clear, EOMI, PERRLA NECK: Trachea midline, non-tender, supple CARDIOVASCULAR: Normal heart rate, Normal rhythm, No murmurs, rubs, gallops PULMONARY/CHEST: Clear to auscultation, no rhonchi, wheezes, or rales. Symmetrical breath sounds. Non-tender. ABDOMINAL: Gravid, fundus to the umbilicus just to the right of midline, soft, non-tender - no rebound or guarding.  BS normal. No vaginal bleeding, external genitalia normal NEUROLOGIC: Non-focal, moving all four extremities, no gross sensory or motor deficits. EXTREMITIES: No clubbing, cyanosis, or edema SKIN: Warm, Dry, No erythema, No rash  ED Course   Procedures (including critical care time)  Labs Reviewed  CBC WITH DIFFERENTIAL - Abnormal; Notable for the following:    WBC 12.6 (*)    RBC 3.60 (*)    Hemoglobin 10.7 (*)    HCT 30.7 (*)    Neutro Abs 9.0 (*)    All other components within normal limits  COMPREHENSIVE METABOLIC PANEL - Abnormal; Notable for the following:    Sodium 134 (*)    Potassium 3.4 (*)    Glucose, Bld 106 (*)    Albumin 2.9 (*)  Total Bilirubin 0.1 (*)    All other components within normal limits   No results found. 1. Fall, initial encounter   2. Pregnancy   3. Gastroesophageal reflux in pregancy, second trimester     MDM  Klani Bugh is a 32 y.o. female presenting after a fall, she did not hit her abdomen, patient has been stable in the emergency department, no abdominal pain, no abdominal cramping, no rush of fluid, no vaginal bleeding.  Bedside ultrasound reveals the fetus with good movement, good heart rate.  Rapid OB nurse also assessed the patient, had the patient on tocometer and monitored heart rate, these are all within normal limits.  She discussed findings with OB/GYN who is okay with discharging the patient.  I do not think this  patient has sustained any significant trauma. I do not pregnancy is in danger. Bedside imaging and Doppler is reassuring. Physical exam is reassuring. Patient does give history of reflux is affecting her appetite, we'll start the patient on famotidine to control reflux.  Referred back to health department for prenatal care. Dcstable and in good condition, family understands accepts medical plan, have answered all questions to their satisfaction.   Jones Skene, MD 02/13/13 [phone]

## 2013-02-12 NOTE — ED Notes (Addendum)
PT. FELT DIZZY AND FELL AT HOME , DENIES INJURY OR PAIN , ALERT AND ORIENTED , PT. STATED 6 MONTHS PREGNANT G8P7 , NO VAGINAL BLEEDING OR DISCHARGE / DENIES ABDOMINAL CRAMPING . SON TRANSLATING ( BURMESE) FOR PT.

## 2013-02-12 NOTE — Progress Notes (Signed)
RROB spoke with Dr Macon Large [facility]), said that monitoring may be d/c'd at this time due to pt's early gestation.

## 2013-02-13 MED ORDER — ACETAMINOPHEN 325 MG PO TABS
650.0000 mg | ORAL_TABLET | Freq: Once | ORAL | Status: AC
  Administered 2013-02-13: 650 mg via ORAL
  Filled 2013-02-13: qty 2

## 2013-02-13 MED ORDER — FAMOTIDINE 20 MG PO TABS
20.0000 mg | ORAL_TABLET | Freq: Every day | ORAL | Status: DC
  Administered 2013-02-13: 20 mg via ORAL
  Filled 2013-02-13: qty 1

## 2013-02-13 MED ORDER — ALUM & MAG HYDROXIDE-SIMETH 200-200-20 MG/5ML PO SUSP
30.0000 mL | Freq: Once | ORAL | Status: AC
  Administered 2013-02-13: 30 mL via ORAL
  Filled 2013-02-13: qty 30

## 2013-02-13 MED ORDER — FAMOTIDINE 20 MG PO TABS: 20.0000 mg | ORAL_TABLET | Freq: Two times a day (BID) | ORAL | Status: DC

## 2013-05-29 ENCOUNTER — Encounter (HOSPITAL_COMMUNITY): Payer: Self-pay | Admitting: Family Medicine

## 2013-05-29 ENCOUNTER — Inpatient Hospital Stay (HOSPITAL_COMMUNITY)
Admission: AD | Admit: 2013-05-29 | Discharge: 2013-05-31 | DRG: 775 | Disposition: A | Discharge status: Home / Self Care | Payer: Medicaid Other | Source: Ambulatory Visit | Attending: Obstetrics & Gynecology | Admitting: Obstetrics & Gynecology

## 2013-05-29 DIAGNOSIS — O0930 Supervision of pregnancy with insufficient antenatal care, unspecified trimester: Secondary | ICD-10-CM

## 2013-05-29 DIAGNOSIS — O0940 Supervision of pregnancy with grand multiparity, unspecified trimester: Secondary | ICD-10-CM

## 2013-05-29 DIAGNOSIS — Z603 Acculturation difficulty: Secondary | ICD-10-CM

## 2013-05-29 LAB — TYPE AND SCREEN
ABO/RH(D): A POS
Antibody Screen: NEGATIVE

## 2013-05-29 LAB — CBC
Hemoglobin: 9.6 g/dL — ABNORMAL LOW (ref 12.0–15.0)
MCH: 24.9 pg — ABNORMAL LOW (ref 26.0–34.0)
Platelets: 292 10*3/uL (ref 150–400)
RBC: 3.85 MIL/uL — ABNORMAL LOW (ref 3.87–5.11)
RDW: 14 % (ref 11.5–15.5)
WBC: 11.6 10*3/uL — ABNORMAL HIGH (ref 4.0–10.5)

## 2013-05-29 LAB — ABO/RH: ABO/RH(D): A POS

## 2013-05-29 LAB — DIFFERENTIAL
Basophils Absolute: 0 10*3/uL (ref 0.0–0.1)
Eosinophils Absolute: 0.2 10*3/uL (ref 0.0–0.7)
Eosinophils Relative: 1 % (ref 0–5)
Lymphs Abs: 2.5 10*3/uL (ref 0.7–4.0)
Neutro Abs: 8.1 10*3/uL — ABNORMAL HIGH (ref 1.7–7.7)
Neutrophils Relative %: 70 % (ref 43–77)

## 2013-05-29 LAB — RAPID HIV SCREEN (WH-MAU): Rapid HIV Screen: NONREACTIVE

## 2013-05-29 LAB — RPR: RPR Ser Ql: NONREACTIVE

## 2013-05-29 MED ORDER — OXYTOCIN 40 UNITS IN LACTATED RINGERS INFUSION - SIMPLE MED: 62.5000 mL/h | INTRAVENOUS | Status: DC

## 2013-05-29 MED ORDER — BENZOCAINE-MENTHOL 20-0.5 % EX AERO: 1.0000 "application " | INHALATION_SPRAY | CUTANEOUS | Status: DC | PRN

## 2013-05-29 MED ORDER — LACTATED RINGERS IV SOLN: INTRAVENOUS | Status: DC

## 2013-05-29 MED ORDER — WITCH HAZEL-GLYCERIN EX PADS: 1.0000 "application " | MEDICATED_PAD | CUTANEOUS | Status: DC | PRN

## 2013-05-29 MED ORDER — SENNOSIDES-DOCUSATE SODIUM 8.6-50 MG PO TABS
2.0000 | ORAL_TABLET | ORAL | Status: DC
  Administered 2013-05-29: 2 via ORAL
  Filled 2013-05-29 (×2): qty 2

## 2013-05-29 MED ORDER — LANOLIN HYDROUS EX OINT: TOPICAL_OINTMENT | CUTANEOUS | Status: DC | PRN

## 2013-05-29 MED ORDER — LIDOCAINE HCL (PF) 1 % IJ SOLN
30.0000 mL | INTRAMUSCULAR | Status: DC | PRN
  Filled 2013-05-29: qty 30

## 2013-05-29 MED ORDER — OXYTOCIN BOLUS FROM INFUSION
500.0000 mL | INTRAVENOUS | Status: DC
  Administered 2013-05-29: 500 mL via INTRAVENOUS

## 2013-05-29 MED ORDER — CITRIC ACID-SODIUM CITRATE 334-500 MG/5ML PO SOLN: 30.0000 mL | ORAL | Status: DC | PRN

## 2013-05-29 MED ORDER — SIMETHICONE 80 MG PO CHEW: 80.0000 mg | CHEWABLE_TABLET | ORAL | Status: DC | PRN

## 2013-05-29 MED ORDER — FLEET ENEMA 7-19 GM/118ML RE ENEM: 1.0000 | ENEMA | RECTAL | Status: DC | PRN

## 2013-05-29 MED ORDER — OXYTOCIN 10 UNIT/ML IJ SOLN
INTRAMUSCULAR | Status: AC
  Filled 2013-05-29: qty 1

## 2013-05-29 MED ORDER — DIPHENHYDRAMINE HCL 25 MG PO CAPS: 25.0000 mg | ORAL_CAPSULE | Freq: Four times a day (QID) | ORAL | Status: DC | PRN

## 2013-05-29 MED ORDER — IBUPROFEN 600 MG PO TABS
600.0000 mg | ORAL_TABLET | Freq: Four times a day (QID) | ORAL | Status: DC
  Administered 2013-05-29 – 2013-05-31 (×8): 600 mg via ORAL
  Filled 2013-05-29 (×8): qty 1

## 2013-05-29 MED ORDER — PRENATAL MULTIVITAMIN CH
1.0000 | ORAL_TABLET | Freq: Every day | ORAL | Status: DC
  Administered 2013-05-29 – 2013-05-30 (×2): 1 via ORAL
  Filled 2013-05-29 (×2): qty 1

## 2013-05-29 MED ORDER — ACETAMINOPHEN 325 MG PO TABS: 650.0000 mg | ORAL_TABLET | ORAL | Status: DC | PRN

## 2013-05-29 MED ORDER — ONDANSETRON HCL 4 MG/2ML IJ SOLN: 4.0000 mg | INTRAMUSCULAR | Status: DC | PRN

## 2013-05-29 MED ORDER — OXYCODONE-ACETAMINOPHEN 5-325 MG PO TABS: 1.0000 | ORAL_TABLET | ORAL | Status: DC | PRN

## 2013-05-29 MED ORDER — ZOLPIDEM TARTRATE 5 MG PO TABS: 5.0000 mg | ORAL_TABLET | Freq: Every evening | ORAL | Status: DC | PRN

## 2013-05-29 MED ORDER — ONDANSETRON HCL 4 MG PO TABS: 4.0000 mg | ORAL_TABLET | ORAL | Status: DC | PRN

## 2013-05-29 MED ORDER — OXYCODONE-ACETAMINOPHEN 5-325 MG PO TABS
1.0000 | ORAL_TABLET | ORAL | Status: DC | PRN
  Administered 2013-05-29: 1 via ORAL
  Filled 2013-05-29: qty 1

## 2013-05-29 MED ORDER — DIBUCAINE 1 % RE OINT: 1.0000 "application " | TOPICAL_OINTMENT | RECTAL | Status: DC | PRN

## 2013-05-29 MED ORDER — TETANUS-DIPHTH-ACELL PERTUSSIS 5-2.5-18.5 LF-MCG/0.5 IM SUSP
0.5000 mL | Freq: Once | INTRAMUSCULAR | Status: AC
  Administered 2013-05-29: 0.5 mL via INTRAMUSCULAR
  Filled 2013-05-29: qty 0.5

## 2013-05-29 MED ORDER — LACTATED RINGERS IV SOLN: 500.0000 mL | INTRAVENOUS | Status: DC | PRN

## 2013-05-29 MED ORDER — ONDANSETRON HCL 4 MG/2ML IJ SOLN: 4.0000 mg | Freq: Four times a day (QID) | INTRAMUSCULAR | Status: DC | PRN

## 2013-05-29 MED ORDER — IBUPROFEN 600 MG PO TABS
600.0000 mg | ORAL_TABLET | Freq: Four times a day (QID) | ORAL | Status: DC | PRN
  Administered 2013-05-29: 600 mg via ORAL
  Filled 2013-05-29: qty 1

## 2013-05-29 MED ORDER — OXYTOCIN 40 UNITS IN LACTATED RINGERS INFUSION - SIMPLE MED
INTRAVENOUS | Status: AC
  Filled 2013-05-29: qty 1000

## 2013-05-29 MED ORDER — LIDOCAINE HCL (PF) 1 % IJ SOLN
INTRAMUSCULAR | Status: AC
  Filled 2013-05-29: qty 30

## 2013-05-29 NOTE — Progress Notes (Signed)
PAtient give VIS for Tdap and influenza vaccine in Burmese.

## 2013-05-29 NOTE — H&P (Signed)
Katherine Roman is a 32 y.o. female presenting for labor. Maternal Medical History:  Reason for admission: Contractions.  Nausea.  Contractions: Onset was 1-2 hours ago.   Frequency: regular.   Perceived severity is moderate.    Fetal activity: Perceived fetal activity is normal.   Last perceived fetal movement was within the past hour.    Prenatal complications: No bleeding.   Prenatal Complications - Diabetes: none.    OB History   Grav Para Term Preterm Abortions TAB SAB Ect Mult Living   8 7 1   0     7     Past Medical History  Diagnosis Date  . TB (pulmonary tuberculosis)     Pt was treated for 8 months  . ZOXWRUEA(540.9)    Past Surgical History  Procedure Laterality Date  . No past surgeries     Family History: family history is negative for Anesthesia problems. Social History:  reports that she has never smoked. She has never used smokeless tobacco. She reports that she does not drink alcohol or use illicit drugs.  Review of Systems  Constitutional: Negative for fever and malaise/fatigue.  Gastrointestinal: Positive for abdominal pain. Negative for nausea, vomiting, diarrhea and constipation.       Contractions   Genitourinary: Negative for dysuria.  Neurological: Negative for dizziness.    Dilation: 10 Effacement (%): 100 Exam by:: S Nix RN Blood pressure 109/71, pulse 71, temperature 98.3 F (36.8 C), temperature source Oral, resp. rate 18, SpO2 100.00%. Maternal Exam:  Uterine Assessment: Contraction strength is firm.  Contraction frequency is regular.   Abdomen: Fundal height is 38.   Estimated fetal weight is 7.   Fetal presentation: vertex  Introitus: Normal vulva. Vagina is positive for vaginal discharge.  Ferning test: not done.  Nitrazine test: not done. Amniotic fluid character: not assessed.  Pelvis: adequate for delivery.   Cervix: Cervix evaluated by digital exam.     Fetal Exam Fetal Monitor Review: Mode: ultrasound.   Baseline rate:  140.  Variability: moderate (6-25 bpm).   Pattern: accelerations present.    Fetal State Assessment: Category I - tracings are normal.     Physical Exam  Constitutional: She is oriented to person, place, and time. She appears well-developed and well-nourished. No distress.  Cardiovascular: Normal rate.   Respiratory: Effort normal.  GI: Soft. She exhibits no distension and no mass. There is no tenderness. There is no rebound and no guarding.  Genitourinary: Uterus normal. Vaginal discharge found.  Cervix 9/100/0/BBOW   Musculoskeletal: Normal range of motion.  Neurological: She is alert and oriented to person, place, and time.  Skin: Skin is warm and dry.  Psychiatric: She has a normal mood and affect.    Prenatal labs: ABO, Rh: --/--/A POS (11/09 0400) Antibody: NEG (11/09 0400) Rubella:   RPR:    HBsAg:    HIV:    GBS:     Assessment/Plan: A;  SIUP at term by appearance      No Prenatal care      Active Labor  P:  ADmit to [facility]      Routine orders      Anticipate SVD  [facility] 05/29/2013, 5:49 AM

## 2013-05-29 NOTE — Progress Notes (Signed)
Admission completed with interpreter.

## 2013-05-29 NOTE — Progress Notes (Signed)
Pt to 170 via stretcher. Wynelle Bourgeois CNM with pt for transfer to [facility]. Report called to DAna RN

## 2013-05-30 NOTE — Progress Notes (Signed)
UR chart review completed.  

## 2013-05-30 NOTE — Progress Notes (Signed)
Post Partum Day 1 Subjective: no complaints, up ad lib, voiding and tolerating PO  Objective: Blood pressure 92/60, pulse 84, temperature 97.9 F (36.6 C), temperature source Oral, resp. rate 18, height 4\' 10"  (1.473 m), weight 59.557 kg (131 lb 4.8 oz), SpO2 97.00%, unknown if currently breastfeeding.  Physical Exam:  General: alert, cooperative and no distress Lochia: appropriate Uterine Fundus: firm Incision: na DVT Evaluation: No evidence of DVT seen on physical exam. No cords or calf tenderness.   Recent Labs  05/29/13 0400  HGB 9.6*  HCT 29.8*    Assessment/Plan: Plan for discharge tomorrow, Breastfeeding, Lactation consult, Social Work consult and Contraception undecided SW for no PNC   LOS: 1 day   Katherine Roman 05/30/2013, 10:59 AM

## 2013-05-30 NOTE — Lactation Note (Signed)
This note was copied from the chart of Katherine Monicia Manzer. Lactation Consultation Note  Patient Name: Katherine Roman Today's Date: 05/30/2013 Reason for consult: Initial assessment Pacific interpreter [phone] and [phone] used for visit. Mom is experienced BF with 7 other children. Denies questions or concerns. Does reports some nipple tenderness. Assisted Mom with positioning and obtaining deep latch with initial latch. No breakdown noted. Advised to apply EBM to sore nipples. Mom is breast and bottle feeding, lots of colostrum with hand expression. Encouraged Mom to BF with each feeding before supplements. Limit supplements to 15 ml today. Discussed risk of early supplementation to BF success, however their is some question as to this baby's gestation. Peds noted baby appears term with exam. Baby demonstrated a good rhythmic suck. Demonstrated to Mom how to keep baby active at the breast. Offered to set up DEBP for Mom to post pump, she declined at this time. Lactation brochure left for review. Advised of OP services and support group. Advised to ask for assist is needed.   Maternal Data Formula Feeding for Exclusion: Yes Reason for exclusion: Mother's choice to formula and breast feed on admission Infant to breast within first hour of birth: No Breastfeeding delayed due to:: Other (comment) (mom could not give me an answer) Has patient been taught Hand Expression?: Yes Does the patient have breastfeeding experience prior to this delivery?: Yes  Feeding Feeding Type: Breast Fed Nipple Type: Regular  LATCH Score/Interventions Latch: Grasps breast easily, tongue down, lips flanged, rhythmical sucking.  Audible Swallowing: A few with stimulation Intervention(s): Skin to skin Intervention(s): Skin to skin  Type of Nipple: Everted at rest and after stimulation  Comfort (Breast/Nipple): Soft / non-tender  Problem noted: Mild/Moderate discomfort  Hold (Positioning): Assistance needed to correctly  position infant at breast and maintain latch. Intervention(s): Breastfeeding basics reviewed;Support Pillows;Position options;Skin to skin  LATCH Score: 8  Lactation Tools Discussed/Used     Consult Status Consult Status: Follow-up Date: 05/31/13 Follow-up type: In-patient    Alfred Levins 05/30/2013, 5:06 PM

## 2013-05-31 DIAGNOSIS — O0930 Supervision of pregnancy with insufficient antenatal care, unspecified trimester: Secondary | ICD-10-CM

## 2013-05-31 MED ORDER — MEDROXYPROGESTERONE ACETATE 150 MG/ML IM SUSP
150.0000 mg | Freq: Once | INTRAMUSCULAR | Status: AC
  Administered 2013-05-31: 150 mg via INTRAMUSCULAR
  Filled 2013-05-31: qty 1

## 2013-05-31 MED ORDER — IBUPROFEN 600 MG PO TABS: 600.0000 mg | ORAL_TABLET | Freq: Four times a day (QID) | ORAL | Status: DC

## 2013-05-31 NOTE — Clinical Social Work Maternal (Signed)
    Clinical Social Work Department PSYCHOSOCIAL ASSESSMENT - MATERNAL/CHILD 05/31/2013  Patient:  Katherine, Roman  Account Number:  [id number]  Admit Date:  05/29/2013  Marjo Bicker Name:   BG Bockrath    Clinical Social Worker:  Nobie Putnam, LCSW   Date/Time:  05/31/2013 01:17 PM  Date Referred:  05/31/2013   Referral source  CN     Referred reason  Other - See comment   Other referral source:    I:  FAMILY / HOME ENVIRONMENT Child's legal guardian:  PARENT  Guardian - Name Guardian - Age Guardian - Address  Meleny Thumma [address] [place], [place] [address]  Spouse     Other household support members/support persons Name Relationship DOB   SON 45 years old   SON 33 years old   DAUGHTER 72 years old   DAUGHTER 109 years old   SON 80 years old   DAUGHTER 61 years old   DAUGHTER 50 year old   Other support:    II  PSYCHOSOCIAL DATA Information Source:  Patient Interview  Event organiser Employment:   Surveyor, quantity resources:  [employer] If Medicaid - County:  [place] Other  Sales executive  WIC   School / Grade:   Maternity Care Coordinator / Child Services Coordination / Early Interventions:  Cultural issues impacting care:    III  STRENGTHS Strengths  Home prepared for Child (including basic supplies)   Strength comment:    IV  RISK FACTORS AND CURRENT PROBLEMS Current Problem:  YES   Risk Factor & Current Problem Patient Issue Family Issue Risk Factor / Current Problem Comment  Other - See comment Y N NPNC    V  SOCIAL WORK ASSESSMENT CSW met with pt to assess reason [facility]  Pt lives with her spouse & 7 other children.  Pt did not have a reason that she didn't establish PNC.  She denies illegal substance use & understands the hospital drug testing policy.  UDS is negative, meconium results are pending.  Pt has some supplies for the baby.  She plans to purchase appropriate sleeping quarters for the baby upon discharge. RN was present in the room & educated  pt about the increase risk of SIDS when co sleeping occurs.  CSW provided pt with a bundle pack of clothing.  Pt plans to discharge after RN administer birth control.  CSW will continue to monitor drug screen results & make a referral if needed.      VI SOCIAL WORK PLAN Social Work Plan  No Further Intervention Required / No Barriers to Discharge   Type of pt/family education:   If child protective services report - county:   If child protective services report - date:   Information/referral to community resources comment:   Other social work plan:

## 2013-05-31 NOTE — Discharge Summary (Signed)
Attestation of Attending Supervision of Advanced Practitioner (PA/CNM/NP): Evaluation and management procedures were performed by the Advanced Practitioner under my supervision and collaboration.  I have reviewed the Advanced Practitioner's note and chart, and I agree with the management and plan.  Hadas Jessop, MD, FACOG Attending Obstetrician & Gynecologist Faculty Practice, [facility]  

## 2013-05-31 NOTE — Discharge Summary (Signed)
Obstetric Discharge Summary Reason for Admission: onset of labor NPC, grandmultiparity Prenatal Procedures: none Intrapartum Procedures: spontaneous vaginal delivery Postpartum Procedures: none Complications-Operative and Postpartum: none  SUBJECTIVE: Burmese interpreter here. NPC because no transportation and 7 other children. No known pregnancy problems. LC here, breast and bottle feeding. No concerns. Desires Depo.   Hemoglobin  Date Value Range Status  05/29/2013 9.6* 12.0 - 15.0 g/dL Final     HCT  Date Value Range Status  05/29/2013 29.8* 36.0 - 46.0 % Final    Physical Exam:  Filed Vitals:   05/31/13 0518  BP: 99/67  Pulse: 111  Temp: 98.7 F (37.1 C)  Resp: 18   General: alert, cooperative and no distress Breasats: soft, nipples erect Lochia: appropriate Uterine Fundus: firm. NT Incision: n/a DVT Evaluation: No evidence of DVT seen on physical exam.  Discharge Diagnoses: Term Pregnancy-delivered  Discharge Information: Date: 05/31/2013 Activity: per booklet Diet: routine Medications: PNV, Ibuprofen and Iron Condition: stable Instructions: refer to practice specific booklet Discharge to: home Follow-up Information   Follow up with [facility]. Schedule an appointment as soon as possible for a visit in 4 weeks.   Specialty:  Obstetrics and Gynecology   Contact information:   [address] [place] [address] [phone]      [facility]. Schedule an appointment as soon as possible for a visit in 1 month.   Specialty:  Obstetrics and Gynecology   Contact information:   [address] [place] [address] [phone]      Newborn Data: Live born female  Birth Weight: 7 lb 5.3 oz (3325 g) APGAR: 9, 9  Home with mother. After SW sees pt.  Depoprovera 150 mg IM predischarge.  Chianne Byrns 05/31/2013, 10:14 AM

## 2013-06-27 ENCOUNTER — Ambulatory Visit: Payer: Medicaid Other | Admitting: Obstetrics and Gynecology

## 2013-07-11 ENCOUNTER — Ambulatory Visit: Payer: Medicaid Other | Admitting: Obstetrics and Gynecology

## 2014-05-22 ENCOUNTER — Encounter (HOSPITAL_COMMUNITY): Payer: Self-pay | Admitting: Family Medicine

## 2014-07-21 NOTE — L&D Delivery Note (Cosign Needed)
Delivery Note  Patient admitted in active labor. Patient progressed without medication augmentation. AROM clear performed approximately 2 hours prior to delivery. I was called to the room when the patient felt an urge to push. She was checked and found to be complete and +1 station. Difficulty ascertaining fetal heart rate at that point. FSE placed and heart rate found to be in 90s. Trialed pushing with two contractions with minimal change in station. Kiwi vacuum extractor was placed and with one pop-off in two contractions fetal vertex was delivered. The shoulders and body were then delivered without complication.  At 2:43 PM a viable female was delivered via  (Presentation: OA).  APGAR: 9/9; weight 3690 g .   Placenta status: intact, cord: 3 vessel  In addition to pitocin, cytotec 1000 mcg was placed rectally for prevention of PPH (given patient's grand multiparity).  Anesthesia:  none Episiotomy:  none Lacerations:  none Est. Blood Loss (mL):  150 ml  Mom to postpartum.  Baby to Couplet care / Skin to Skin.  Katherine Roman 05/21/2015, 2:58 PM

## 2014-11-14 ENCOUNTER — Encounter (HOSPITAL_COMMUNITY): Payer: Self-pay | Admitting: Emergency Medicine

## 2014-11-14 ENCOUNTER — Encounter (HOSPITAL_COMMUNITY): Payer: Self-pay

## 2014-11-14 ENCOUNTER — Emergency Department (HOSPITAL_COMMUNITY): Payer: Medicaid Other

## 2014-11-14 ENCOUNTER — Emergency Department (INDEPENDENT_AMBULATORY_CARE_PROVIDER_SITE_OTHER)
Admission: EM | Admit: 2014-11-14 | Discharge: 2014-11-14 | Disposition: A | Discharge status: Home / Self Care | Payer: Medicaid Other | Source: Home / Self Care | Attending: Family Medicine | Admitting: Family Medicine

## 2014-11-14 ENCOUNTER — Emergency Department (HOSPITAL_COMMUNITY)
Admission: EM | Admit: 2014-11-14 | Discharge: 2014-11-15 | Disposition: A | Discharge status: Home / Self Care | Payer: Medicaid Other | Attending: Emergency Medicine | Admitting: Emergency Medicine

## 2014-11-14 DIAGNOSIS — Z79899 Other long term (current) drug therapy: Secondary | ICD-10-CM | POA: Diagnosis not present

## 2014-11-14 DIAGNOSIS — O9989 Other specified diseases and conditions complicating pregnancy, childbirth and the puerperium: Secondary | ICD-10-CM | POA: Diagnosis not present

## 2014-11-14 DIAGNOSIS — Z3A00 Weeks of gestation of pregnancy not specified: Secondary | ICD-10-CM | POA: Insufficient documentation

## 2014-11-14 DIAGNOSIS — R531 Weakness: Secondary | ICD-10-CM | POA: Diagnosis not present

## 2014-11-14 DIAGNOSIS — R110 Nausea: Secondary | ICD-10-CM | POA: Insufficient documentation

## 2014-11-14 DIAGNOSIS — R509 Fever, unspecified: Secondary | ICD-10-CM

## 2014-11-14 DIAGNOSIS — J069 Acute upper respiratory infection, unspecified: Secondary | ICD-10-CM | POA: Insufficient documentation

## 2014-11-14 DIAGNOSIS — Z8611 Personal history of tuberculosis: Secondary | ICD-10-CM | POA: Diagnosis not present

## 2014-11-14 DIAGNOSIS — O99519 Diseases of the respiratory system complicating pregnancy, unspecified trimester: Secondary | ICD-10-CM | POA: Insufficient documentation

## 2014-11-14 DIAGNOSIS — J988 Other specified respiratory disorders: Secondary | ICD-10-CM

## 2014-11-14 DIAGNOSIS — B9789 Other viral agents as the cause of diseases classified elsewhere: Secondary | ICD-10-CM

## 2014-11-14 LAB — URINALYSIS, ROUTINE W REFLEX MICROSCOPIC
BILIRUBIN URINE: NEGATIVE
Glucose, UA: NEGATIVE mg/dL
HGB URINE DIPSTICK: NEGATIVE
KETONES UR: NEGATIVE mg/dL
Leukocytes, UA: NEGATIVE
Nitrite: NEGATIVE
Protein, ur: NEGATIVE mg/dL
Specific Gravity, Urine: 1.013 (ref 1.005–1.030)
Urobilinogen, UA: 0.2 mg/dL (ref 0.0–1.0)
pH: 7 (ref 5.0–8.0)

## 2014-11-14 LAB — BASIC METABOLIC PANEL
Anion gap: 11 (ref 5–15)
BUN: 10 mg/dL (ref 6–23)
CALCIUM: 8.5 mg/dL (ref 8.4–10.5)
CHLORIDE: 101 mmol/L (ref 96–112)
CO2: 20 mmol/L (ref 19–32)
Creatinine, Ser: 0.59 mg/dL (ref 0.50–1.10)
GFR calc non Af Amer: >90 mL/min (ref >90)
GLUCOSE: 97 mg/dL (ref 70–99)
Potassium: 3.9 mmol/L (ref 3.5–5.1)
Sodium: 132 mmol/L — ABNORMAL LOW (ref 135–145)

## 2014-11-14 LAB — CBC
HCT: 33.7 % — ABNORMAL LOW (ref 36.0–46.0)
Hemoglobin: 11.3 g/dL — ABNORMAL LOW (ref 12.0–15.0)
MCH: 28.1 pg (ref 26.0–34.0)
MCHC: 33.5 g/dL (ref 30.0–36.0)
MCV: 83.8 fL (ref 78.0–100.0)
PLATELETS: 295 10*3/uL (ref 150–400)
RBC: 4.02 MIL/uL (ref 3.87–5.11)
RDW: 12.9 % (ref 11.5–15.5)
WBC: 8.8 10*3/uL (ref 4.0–10.5)

## 2014-11-14 LAB — I-STAT CG4 LACTIC ACID, ED: Lactic Acid, Venous: 0.67 mmol/L (ref 0.5–2.0)

## 2014-11-14 LAB — I-STAT TROPONIN, ED: Troponin i, poc: 0 ng/mL (ref 0.00–0.08)

## 2014-11-14 LAB — POC URINE PREG, ED: PREG TEST UR: POSITIVE — AB

## 2014-11-14 MED ORDER — ACETAMINOPHEN 325 MG PO TABS
650.0000 mg | ORAL_TABLET | Freq: Once | ORAL | Status: AC
  Administered 2014-11-14: 650 mg via ORAL
  Filled 2014-11-14: qty 2

## 2014-11-14 MED ORDER — SODIUM CHLORIDE 0.9 % IV BOLUS (SEPSIS)
2000.0000 mL | Freq: Once | INTRAVENOUS | Status: AC
  Administered 2014-11-14: 2000 mL via INTRAVENOUS

## 2014-11-14 NOTE — ED Provider Notes (Signed)
Katherine Roman is a 34 y.o. female who presents to Urgent Care today for fever. Patient has a two-month history of fever cough and congestion. Symptoms have been off and on but worsened recently over the last 2 days. She is quite ill according to her son. She is very fatigued and has trouble getting out of bed. She is nauseated but denies any vomiting. No chest pains or palpitations. No Western medicines tried yet.   Past Medical History  Diagnosis Date  . TB (pulmonary tuberculosis)     Pt was treated for 8 months  . UJWJXBJY(782.9Headache(784.0)    Past Surgical History  Procedure Laterality Date  . No past surgeries     History  Substance Use Topics  . Smoking status: Never Smoker   . Smokeless tobacco: Never Used  . Alcohol Use: No   ROS as above Medications: No current facility-administered medications for this encounter.   Current Outpatient Prescriptions  Medication Sig Dispense Refill  . famotidine (PEPCID) 20 MG tablet Take 1 tablet (20 mg total) by mouth 2 (two) times daily. 30 tablet 0  . ibuprofen (ADVIL,MOTRIN) 600 MG tablet Take 1 tablet (600 mg total) by mouth every 6 (six) hours. 30 tablet 0  . Prenatal Vit-Fe Fumarate-FA (PRENATAL MULTIVITAMIN) TABS Take 1 tablet by mouth daily at 12 noon.     No Known Allergies   Exam:  BP 91/60 mmHg  Pulse 82  Temp(Src) 100.3 F (37.9 C) (Oral)  Resp 24  SpO2 96%  LMP  (LMP Unknown) Gen:  ill-appearing wrapped in blankets laying in bed HEENT: EOMI,  MMM Lungs: Tachypnea Normal work of breathing. CTABL Heart: RRR no MRG Abd: NABS, Soft. Nondistended, Nontender Exts: Brisk capillary refill, warm and well perfused.   No results found for this or any previous visit (from the past 24 hour(s)). No results found.  Assessment and Plan: 34 y.o. female with fever fatigue hypotension. Transfer to ED for further evaluation and management.  Discussed warning signs or symptoms. Please see discharge instructions. Patient expresses  understanding.     Rodolph BongEvan S Corey, MD 11/14/14 [phone]

## 2014-11-14 NOTE — ED Notes (Signed)
Pt being transferred to the ED via shuttle.  Katherine Roman, ED First RN was given report.  Pt was sent with a mask on.

## 2014-11-14 NOTE — ED Notes (Addendum)
Pt speaks Burmese.  Her 856 year old son is translating for her.  Pt has been sick for two months with fever, body aches, nausea, chills, cough, and fatigue.  The son says she has used "environmental treatments".

## 2014-11-14 NOTE — ED Notes (Signed)
Pt here with son, speaks Burmese, is pregnant, last menses in Feb. Due sometime in October. Has appt with OB on May 12. For the past 2 months has been nauseated, chills, having dizziness off and on. Cough with yellow sputum and feeling hot but not checking her temp. Sx have been worse the past 2 days.

## 2014-11-14 NOTE — ED Provider Notes (Addendum)
CSN: [id number]     Arrival date & time 11/14/14  1815 History   First MD Initiated Contact with Patient 11/14/14 2149     Chief Complaint  Patient presents with  . Chills  . Cough  . Morning Sickness   Patient speaks no [place]. History is obtained through professional interpreter using [employer] language line  (Consider location/radiation/quality/duration/timing/severity/associated sxs/prior Treatment) HPI Complains of cough productive of white sputum for approximately one month with subjective fever and chills and lightheadedness. Also complains of mild shortness of breath. Admits to nausea. No vomiting. No other associated symptoms. No treatment prior to coming here. Denies headache denies abdominal pain. Evaluated [facility] care prior to coming here. Sent for further evaluation. Patient has been in the [place] for 5 years. Past Medical History  Diagnosis Date  . TB (pulmonary tuberculosis)     Pt was treated for 8 months  . HYQMVHQI(696.2)    Past Surgical History  Procedure Laterality Date  . No past surgeries     Family History  Problem Relation Age of Onset  . Anesthesia problems Neg Hx    History  Substance Use Topics  . Smoking status: Never Smoker   . Smokeless tobacco: Never Used  . Alcohol Use: No   OB History    Gravida Para Term Preterm AB TAB SAB Ectopic Multiple Living   0     8     Review of Systems  HENT: Negative.   Respiratory: Positive for cough and shortness of breath.   Cardiovascular: Negative.   Gastrointestinal: Positive for nausea.       Denies nausea presently  Genitourinary:       Pregnant. Last normal menstrual period February 2016. No prenatal care yet. Prenatal care scheduled for May 2016  Musculoskeletal: Negative.   Skin: Negative.   Neurological: Positive for weakness.       Generalized weakness  Psychiatric/Behavioral: Negative.   All other systems reviewed and are negative.     Allergies  Review of  patient's allergies indicates no known allergies.  Home Medications   Prior to Admission medications   Medication Sig Start Date End Date Taking? Authorizing Provider  famotidine (PEPCID) 20 MG tablet Take 1 tablet (20 mg total) by mouth 2 (two) times daily. 02/13/13   John-Adam Bonk, MD  ibuprofen (ADVIL,MOTRIN) 600 MG tablet Take 1 tablet (600 mg total) by mouth every 6 (six) hours. 05/31/13   Minta Balsam, MD  Prenatal Vit-Fe Fumarate-FA (PRENATAL MULTIVITAMIN) TABS Take 1 tablet by mouth daily at 12 noon.    Historical Provider, MD   BP 101/62 mmHg  Pulse 87  Temp(Src) 100.7 F (38.2 C) (Oral)  Resp 24  SpO2 96%  LMP  (LMP Unknown)  Breastfeeding? No Physical Exam  Constitutional: She is oriented to person, place, and time. She appears well-developed and well-nourished. No distress.  HENT:  Head: Normocephalic and atraumatic.  Right Ear: External ear normal.  Left Ear: External ear normal.  Mucous membranes dry  Eyes: Conjunctivae are normal. Pupils are equal, round, and reactive to light.  Neck: Neck supple. No tracheal deviation present. No thyromegaly present.  Cardiovascular: Normal rate and regular rhythm.   No murmur heard. Pulmonary/Chest: Effort normal and breath sounds normal.  Abdominal: Soft. Bowel sounds are normal. She exhibits no distension. There is no tenderness.  Musculoskeletal: Normal range of motion. She exhibits no edema or tenderness.  Neurological: She is alert and oriented to person,  place, and time. No cranial nerve deficit. Coordination normal.  Skin: Skin is warm and dry. No rash noted.  Psychiatric: She has a normal mood and affect.  Nursing note and vitals reviewed.   ED Course  Procedures (including critical care time) Labs Review Labs Reviewed  CBC - Abnormal; Notable for the following:    Hemoglobin 11.3 (*)    HCT 33.7 (*)    All other components within normal limits  BASIC METABOLIC PANEL - Abnormal; Notable for the following:     Sodium 132 (*)    All other components within normal limits  I-STAT TROPOININ, ED    Imaging Review No results found.   EKG Interpretation   Date/Time:  Tuesday November 14 2014 19:22:19 EDT Ventricular Rate:  91 PR Interval:  146 QRS Duration: 72 QT Interval:  346 QTC Calculation: 425 R Axis:   82 Text Interpretation:  Normal sinus rhythm Normal ECG No significant change  since last tracing Confirmed by Ethelda Chick  MD, Cindia Hustead [phone]) on 11/14/2014  11:14:13 PM     12:35 AM patient reports through [employer] interpreter that she feels much improved after treatment with Tylenol and intravenous fluids. She is alert and ambulatory. Not lightheaded on standing. Chest x-ray viewed by me Results for orders placed or performed during the hospital encounter of 11/14/14  CBC  Result Value Ref Range   WBC 8.8 4.0 - 10.5 K/uL   RBC 4.02 3.87 - 5.11 MIL/uL   Hemoglobin 11.3 (L) 12.0 - 15.0 g/dL   HCT 60.4 (L) 54.0 - 98.1 %   MCV 83.8 78.0 - 100.0 fL   MCH 28.1 26.0 - 34.0 pg   MCHC 33.5 30.0 - 36.0 g/dL   RDW 19.1 47.8 - 29.5 %   Platelets 295 150 - 400 K/uL  Basic metabolic panel  Result Value Ref Range   Sodium 132 (L) 135 - 145 mmol/L   Potassium 3.9 3.5 - 5.1 mmol/L   Chloride 101 96 - 112 mmol/L   CO2 20 19 - 32 mmol/L   Glucose, Bld 97 70 - 99 mg/dL   BUN 10 6 - 23 mg/dL   Creatinine, Ser 6.21 0.50 - 1.10 mg/dL   Calcium 8.5 8.4 - 30.8 mg/dL   GFR calc non Af Amer >90 >90 mL/min   GFR calc Af Amer >90 >90 mL/min   Anion gap 11 5 - 15  Urinalysis, Routine w reflex microscopic  Result Value Ref Range   Color, Urine YELLOW YELLOW   APPearance CLEAR CLEAR   Specific Gravity, Urine 1.013 1.005 - 1.030   pH 7.0 5.0 - 8.0   Glucose, UA NEGATIVE NEGATIVE mg/dL   Hgb urine dipstick NEGATIVE NEGATIVE   Bilirubin Urine NEGATIVE NEGATIVE   Ketones, ur NEGATIVE NEGATIVE mg/dL   Protein, ur NEGATIVE NEGATIVE mg/dL   Urobilinogen, UA 0.2 0.0 - 1.0 mg/dL   Nitrite NEGATIVE  NEGATIVE   Leukocytes, UA NEGATIVE NEGATIVE  I-stat troponin, ED (not at [facility])  Result Value Ref Range   Troponin i, poc 0.00 0.00 - 0.08 ng/mL   Comment 3          POC urine preg, ED (not at [facility])  Result Value Ref Range   Preg Test, Ur POSITIVE (A) NEGATIVE  I-Stat CG4 Lactic Acid, ED  Result Value Ref Range   Lactic Acid, Venous 0.67 0.5 - 2.0 mmol/L   Dg Chest 1 View  11/15/2014   CLINICAL DATA:  34 year old pregnant female with  shortness of breath and cough  EXAM: CHEST  1 VIEW  COMPARISON:  Prior chest x-ray 08/10/2009  FINDINGS: The lungs are clear and negative for focal airspace consolidation, pulmonary edema or suspicious pulmonary nodule. No pleural effusion or pneumothorax. Cardiac and mediastinal contours are within normal limits. No acute fracture or lytic or blastic osseous lesions. The visualized upper abdominal bowel gas pattern is unremarkable.  IMPRESSION: No active cardiopulmonary disease.   Electronically Signed   By: Malachy MoanHeath  McCullough M.D.   On: 11/15/2014 00:12    MDM  Suspect the patient has viral respiratory illness. Plan encourage oral hydration. Tylenol for fever. Keep scheduled appointment for prenatal visit on 11/30/2014. Patient has prenatal vitamins   Instructions given to patient verbally by me through [place] professional interpreter using [employer] language line Final diagnoses:  None   diagnosis #1 viral respiratory illness #2 mild dehydration      Doug SouSam Evart Mcdonnell, MD 11/15/14 [phone]  Doug SouSam Seraj Dunnam, MD 11/15/14 0040

## 2014-11-15 NOTE — Discharge Instructions (Signed)
Viral Infections Take Tylenol every 4 hours as directed for temperature higher than 100.4 while awake. Make sure you drink lots of fluids. Drink at least 6 eight ounce glasses of water or Gatorade daily.  Take  Emetrol as needed for nausea. Emetrol can be purchased at the pharmacy without a prescription .Take your prenatal vitamins each day as directed. Keep your scheduled appointment for prenatal care on 11/30/2014. Return if you feel worse for any reason or go [facility] the [facility] A virus is a type of germ. Viruses can cause:  Minor sore throats.  Aches and pains.  Headaches.  Runny nose.  Rashes.  Watery eyes.  Tiredness.  Coughs.  Loss of appetite.  Feeling sick to your stomach (nausea).  Throwing up (vomiting).  Watery poop (diarrhea). HOME CARE   Only take medicines as told by your doctor.  Drink enough water and fluids to keep your pee (urine) clear or pale yellow. Sports drinks are a good choice.  Get plenty of rest and eat healthy. Soups and broths with crackers or rice are fine. GET HELP RIGHT AWAY IF:   You have a very bad headache.  You have shortness of breath.  You have chest pain or neck pain.  You have an unusual rash.  You cannot stop throwing up.  You have watery poop that does not stop.  You cannot keep fluids down.  You or your child has a temperature by mouth above 102 F (38.9 C), not controlled by medicine.  Your baby is older than 3 months with a rectal temperature of 102 F (38.9 C) or higher.  Your baby is 543 months old or younger with a rectal temperature of 100.4 F (38 C) or higher. MAKE SURE YOU:   Understand these instructions.  Will watch this condition.  Will get help right away if you are not doing well or get worse. Document Released: 06/19/2008 Document Revised: 09/29/2011 Document Reviewed: 11/12/2010 [facility] Patient Information 2015 [employer], [place]. This information is not intended  to replace advice given to you by your health care provider. Make sure you discuss any questions you have with your health care provider.

## 2014-11-15 NOTE — ED Notes (Signed)
Pt. Left with all belongings 

## 2014-12-04 ENCOUNTER — Other Ambulatory Visit (HOSPITAL_COMMUNITY): Payer: Self-pay | Admitting: Physician Assistant

## 2014-12-04 DIAGNOSIS — Z3682 Encounter for antenatal screening for nuchal translucency: Secondary | ICD-10-CM

## 2014-12-04 LAB — OB RESULTS CONSOLE HGB/HCT, BLOOD
HCT: 36 %
HEMOGLOBIN: 11.4 g/dL

## 2014-12-04 LAB — OB RESULTS CONSOLE GC/CHLAMYDIA
Chlamydia: NEGATIVE
Gonorrhea: NEGATIVE

## 2014-12-04 LAB — OB RESULTS CONSOLE RUBELLA ANTIBODY, IGM: RUBELLA: IMMUNE

## 2014-12-04 LAB — OB RESULTS CONSOLE ABO/RH: RH Type: POSITIVE

## 2014-12-04 LAB — OB RESULTS CONSOLE HEPATITIS B SURFACE ANTIGEN: HEP B S AG: NEGATIVE

## 2014-12-04 LAB — OB RESULTS CONSOLE PLATELET COUNT: Platelets: 319 10*3/uL

## 2014-12-04 LAB — OB RESULTS CONSOLE RPR: RPR: NONREACTIVE

## 2014-12-04 LAB — GLUCOSE TOLERANCE, 1 HOUR (50G) W/O FASTING: Glucose 1 Hr Prenatal, POC: 154 mg/dL

## 2014-12-04 LAB — LEAD, BLOOD: Lead, Blood: 1.4

## 2014-12-04 LAB — CYSTIC FIBROSIS DIAGNOSTIC STUDY: Interpretation-CFDNA:: NEGATIVE

## 2014-12-04 LAB — OB RESULTS CONSOLE ANTIBODY SCREEN: Antibody Screen: NEGATIVE

## 2014-12-05 ENCOUNTER — Ambulatory Visit (HOSPITAL_COMMUNITY): Payer: Medicaid Other

## 2014-12-05 ENCOUNTER — Ambulatory Visit (HOSPITAL_COMMUNITY): Payer: Medicaid Other | Attending: Nurse Practitioner

## 2014-12-05 LAB — OB RESULTS CONSOLE HIV ANTIBODY (ROUTINE TESTING): HIV: NONREACTIVE

## 2014-12-08 LAB — GLUCOSE, 3 HOUR GESTATIONAL
GLUCOSE 2 HOUR GTT: 156 mg/dL — AB (ref ?–140)
Glucose, Fasting: 94 mg/dL (ref 60–109)
Glucose, GTT - 1 Hour: 235 mg/dL — AB (ref ?–200)
Glucose, GTT - 3 Hour: 115 mg/dL (ref ?–140)

## 2014-12-11 ENCOUNTER — Encounter: Payer: Medicaid Other | Attending: Obstetrics and Gynecology | Admitting: *Deleted

## 2014-12-11 ENCOUNTER — Ambulatory Visit: Payer: Medicaid Other | Admitting: *Deleted

## 2014-12-11 DIAGNOSIS — O24419 Gestational diabetes mellitus in pregnancy, unspecified control: Secondary | ICD-10-CM

## 2014-12-11 DIAGNOSIS — Z713 Dietary counseling and surveillance: Secondary | ICD-10-CM | POA: Diagnosis not present

## 2014-12-11 NOTE — Progress Notes (Signed)
Pt seen today for GDM diet education.  Normal BMI pregravid based on reported pregravid wt of 115#.  Pt unsure of EDC, but reports she is about 4 mos pregnant.  No N/V reported.  C/o dizziness and headaches.  Adequate intake reported.  Taking PNV.  Pt given written and verbal GDM diet education.  Pt agrees to follow GDM diet including 3 meals + 3 snacks/day and proper carbohydrate/protein combination.  Pt is WIC participant.  Pt will be seen for follow-up as needed.    Melanee LeftErin Cashwell, MPH RD LDN

## 2014-12-11 NOTE — Progress Notes (Addendum)
  Patient was seen on 12/11/14 for Gestational Diabetes self-management . The following learning objectives were met by the patient :   States the definition of Gestational Diabetes  States when to check blood glucose levels  Demonstrates proper blood glucose monitoring techniques  States the effect of stress and exercise on blood glucose levels  States the importance of limiting caffeine and abstaining from alcohol and smoking  Plan:  Consider  increasing your activity level by walking daily as tolerated Begin checking BG before breakfast and 2 hours after first bit of breakfast, lunch and dinner after  as directed by MD  Take medication  as directed by MD  Blood glucose monitor given: True Track Lot # [id number] Exp: 2017/02/17 Blood glucose reading: $RemoveBeforeDE'98mg'PeBycKycgFsLsfy$ /dl 2hpp  Patient instructed to monitor glucose levels: FBS: 60 - <90 2 hour: <120  Patient received the following handouts:  Nutrition Diabetes and Pregnancy  Carbohydrate Counting List  Meal Planning worksheet  Patient will be seen for follow-up as needed. Visit initially utilizing [place] Interpreters: Mietha # [id number], changed to [place]: Moo Zar

## 2014-12-12 DIAGNOSIS — Z8759 Personal history of other complications of pregnancy, childbirth and the puerperium: Secondary | ICD-10-CM | POA: Insufficient documentation

## 2014-12-12 DIAGNOSIS — R7611 Nonspecific reaction to tuberculin skin test without active tuberculosis: Secondary | ICD-10-CM

## 2014-12-12 DIAGNOSIS — O09529 Supervision of elderly multigravida, unspecified trimester: Secondary | ICD-10-CM | POA: Insufficient documentation

## 2014-12-12 DIAGNOSIS — Z8619 Personal history of other infectious and parasitic diseases: Secondary | ICD-10-CM | POA: Insufficient documentation

## 2014-12-12 DIAGNOSIS — O09522 Supervision of elderly multigravida, second trimester: Secondary | ICD-10-CM

## 2014-12-19 ENCOUNTER — Encounter: Payer: Self-pay | Admitting: *Deleted

## 2014-12-20 ENCOUNTER — Encounter: Payer: Self-pay | Admitting: *Deleted

## 2014-12-25 ENCOUNTER — Encounter: Payer: Self-pay | Admitting: Family Medicine

## 2014-12-25 ENCOUNTER — Ambulatory Visit (INDEPENDENT_AMBULATORY_CARE_PROVIDER_SITE_OTHER): Payer: Self-pay | Admitting: Family Medicine

## 2014-12-25 VITALS — BP 102/62 | HR 83 | Temp 98.2°F | Wt 125.0 lb

## 2014-12-25 DIAGNOSIS — O24312 Unspecified pre-existing diabetes mellitus in pregnancy, second trimester: Secondary | ICD-10-CM

## 2014-12-25 DIAGNOSIS — O0990 Supervision of high risk pregnancy, unspecified, unspecified trimester: Secondary | ICD-10-CM | POA: Insufficient documentation

## 2014-12-25 DIAGNOSIS — O24319 Unspecified pre-existing diabetes mellitus in pregnancy, unspecified trimester: Secondary | ICD-10-CM | POA: Insufficient documentation

## 2014-12-25 DIAGNOSIS — O0992 Supervision of high risk pregnancy, unspecified, second trimester: Secondary | ICD-10-CM

## 2014-12-25 DIAGNOSIS — O09522 Supervision of elderly multigravida, second trimester: Secondary | ICD-10-CM

## 2014-12-25 LAB — POCT URINALYSIS DIP (DEVICE)
BILIRUBIN URINE: NEGATIVE
GLUCOSE, UA: NEGATIVE mg/dL
HGB URINE DIPSTICK: NEGATIVE
Ketones, ur: NEGATIVE mg/dL
Nitrite: NEGATIVE
Protein, ur: NEGATIVE mg/dL
Specific Gravity, Urine: 1.025 (ref 1.005–1.030)
Urobilinogen, UA: 0.2 mg/dL (ref 0.0–1.0)
pH: 6.5 (ref 5.0–8.0)

## 2014-12-25 LAB — COMPREHENSIVE METABOLIC PANEL
ALBUMIN: 3.4 g/dL — AB (ref 3.5–5.2)
ALT: 8 U/L (ref 0–35)
AST: 12 U/L (ref 0–37)
Alkaline Phosphatase: 54 U/L (ref 39–117)
BILIRUBIN TOTAL: 0.2 mg/dL (ref 0.2–1.2)
BUN: 8 mg/dL (ref 6–23)
CHLORIDE: 100 meq/L (ref 96–112)
CO2: 24 mEq/L (ref 19–32)
Calcium: 8.7 mg/dL (ref 8.4–10.5)
Creat: 0.45 mg/dL — ABNORMAL LOW (ref 0.50–1.10)
Glucose, Bld: 73 mg/dL (ref 70–99)
Potassium: 4.4 mEq/L (ref 3.5–5.3)
SODIUM: 132 meq/L — AB (ref 135–145)
TOTAL PROTEIN: 6.5 g/dL (ref 6.0–8.3)

## 2014-12-25 LAB — HEMOGLOBIN A1C
Hgb A1c MFr Bld: 5.9 % — ABNORMAL HIGH (ref <5.7)
MEAN PLASMA GLUCOSE: 123 mg/dL — AB (ref <117)

## 2014-12-25 MED ORDER — ASPIRIN EC 81 MG PO TBEC: 81.0000 mg | DELAYED_RELEASE_TABLET | Freq: Every day | ORAL | Status: DC

## 2014-12-25 NOTE — Progress Notes (Signed)
Used Lawyerlanguage line Burmese interpreter number: [phone]

## 2014-12-25 NOTE — Patient Instructions (Signed)
Gestational Diabetes Mellitus Gestational diabetes mellitus, often simply referred to as gestational diabetes, is a type of diabetes that some women develop during pregnancy. In gestational diabetes, the pancreas does not make enough insulin (a hormone), the cells are less responsive to the insulin that is made (insulin resistance), or both.Normally, insulin moves sugars from food into the tissue cells. The tissue cells use the sugars for energy. The lack of insulin or the lack of normal response to insulin causes excess sugars to build up in the blood instead of going into the tissue cells. As a result, high blood sugar (hyperglycemia) develops. The effect of high sugar (glucose) levels can cause many problems.  RISK FACTORS You have an increased chance of developing gestational diabetes if you have a family history of diabetes and also have one or more of the following risk factors:  A body mass index over 30 (obesity).  A previous pregnancy with gestational diabetes.  An older age at the time of pregnancy. If blood glucose levels are kept in the normal range during pregnancy, women can have a healthy pregnancy. If your blood glucose levels are not well controlled, there may be risks to you, your unborn baby (fetus), your labor and delivery, or your newborn baby.  SYMPTOMS  If symptoms are experienced, they are much like symptoms you would normally expect during pregnancy. The symptoms of gestational diabetes include:   Increased thirst (polydipsia).  Increased urination (polyuria).  Increased urination during the night (nocturia).  Weight loss. This weight loss may be rapid.  Frequent, recurring infections.  Tiredness (fatigue).  Weakness.  Vision changes, such as blurred vision.  Fruity smell to your breath.  Abdominal pain. DIAGNOSIS Diabetes is diagnosed when blood glucose levels are increased. Your blood glucose level may be checked by one or more of the following blood  tests:  A fasting blood glucose test. You will not be allowed to eat for at least 8 hours before a blood sample is taken.  A random blood glucose test. Your blood glucose is checked at any time of the day regardless of when you ate.  A hemoglobin A1c blood glucose test. A hemoglobin A1c test provides information about blood glucose control over the previous 3 months.  An oral glucose tolerance test (OGTT). Your blood glucose is measured after you have not eaten (fasted) for 1-3 hours and then after you drink a glucose-containing beverage. Since the hormones that cause insulin resistance are highest at about 24-28 weeks of a pregnancy, an OGTT is usually performed during that time. If you have risk factors for gestational diabetes, your health care provider may test you for gestational diabetes earlier than 24 weeks of pregnancy. TREATMENT   You will need to take diabetes medicine or insulin daily to keep blood glucose levels in the desired range.  You will need to match insulin dosing with exercise and healthy food choices. The treatment goal is to maintain the before-meal (preprandial), bedtime, and overnight blood glucose level at 60-99 mg/dL during pregnancy. The treatment goal is to further maintain peak after-meal blood sugar (postprandial glucose) level at 100-140 mg/dL. HOME CARE INSTRUCTIONS   Have your hemoglobin A1c level checked twice a year.  Perform daily blood glucose monitoring as directed by your health care provider. It is common to perform frequent blood glucose monitoring.  Monitor urine ketones when you are ill and as directed by your health care provider.  Take your diabetes medicine and insulin as directed by your health care provider   to maintain your blood glucose level in the desired range.  Never run out of diabetes medicine or insulin. It is needed every day.  Adjust insulin based on your intake of carbohydrates. Carbohydrates can raise blood glucose levels but  need to be included in your diet. Carbohydrates provide vitamins, minerals, and fiber which are an essential part of a healthy diet. Carbohydrates are found in fruits, vegetables, whole grains, dairy products, legumes, and foods containing added sugars.  Eat healthy foods. Alternate 3 meals with 3 snacks.  Maintain a healthy weight gain. The usual total expected weight gain varies according to your prepregnancy body mass index (BMI).  Carry a medical alert card or wear your medical alert jewelry.  Carry a 15-gram carbohydrate snack with you at all times to treat low blood glucose (hypoglycemia). Some examples of 15-gram carbohydrate snacks include:  Glucose tablets, 3 or 4.  Glucose gel, 15-gram tube.  Raisins, 2 tablespoons (24 g).  Jelly beans, 6.  Animal crackers, 8.  Fruit juice, regular soda, or low-fat milk, 4 ounces (120 mL).  Gummy treats, 9.  Recognize hypoglycemia. Hypoglycemia during pregnancy occurs with blood glucose levels of 60 mg/dL and below. The risk for hypoglycemia increases when fasting or skipping meals, during or after intense exercise, and during sleep. Hypoglycemia symptoms can include:  Tremors or shakes.  Decreased ability to concentrate.  Sweating.  Increased heart rate.  Headache.  Dry mouth.  Hunger.  Irritability.  Anxiety.  Restless sleep.  Altered speech or coordination.  Confusion.  Treat hypoglycemia promptly. If you are alert and able to safely swallow, follow the 15:15 rule:  Take 15-20 grams of rapid-acting glucose or carbohydrate. Rapid-acting options include glucose gel, glucose tablets, or 4 ounces (120 mL) of fruit juice, regular soda, or low-fat milk.  Check your blood glucose level 15 minutes after taking the glucose.  Take 15-20 grams more of glucose if the repeat blood glucose level is still 70 mg/dL or below.  Eat a meal or snack within 1 hour once blood glucose levels return to normal.  Be alert to polyuria  (excess urination) and polydipsia (excess thirst) which are early signs of hyperglycemia. An early awareness of hyperglycemia allows for prompt treatment. Treat hyperglycemia as directed by your health care provider.  Engage in at least 30 minutes of physical activity a day or as directed by your health care provider. Ten minutes of physical activity timed 30 minutes after each meal is encouraged to control postprandial blood glucose levels.  Adjust your insulin dosing and food intake as needed if you start a new exercise or sport.  Follow your sick-day plan at any time you are unable to eat or drink as usual.  Avoid tobacco and alcohol use.  Keep all follow-up visits as directed by your health care provider.  Follow the advice of your health care provider regarding your prenatal and post-delivery (postpartum) appointments, meal planning, exercise, medicines, vitamins, blood tests, other medical tests, and physical activities.  Perform daily skin and foot care. Examine your skin and feet daily for cuts, bruises, redness, nail problems, bleeding, blisters, or sores.  Brush your teeth and gums at least twice a day and floss at least once a day. Follow up with your dentist regularly.  Schedule an eye exam during the first trimester of your pregnancy or as directed by your health care provider.  Share your diabetes management plan with your workplace or school.  Stay up-to-date with immunizations.  Learn to manage stress.    Obtain ongoing diabetes education and support as needed.  Learn about and consider breastfeeding your baby.  You should have your blood sugar level checked 6-12 weeks after delivery. This is done with an oral glucose tolerance test (OGTT). SEEK MEDICAL CARE IF:   You are unable to eat food or drink fluids for more than 6 hours.  You have nausea and vomiting for more than 6 hours.  You have a blood glucose level of 200 mg/dL and you have ketones in your  urine.  There is a change in mental status.  You develop vision problems.  You have a persistent headache.  You have upper abdominal pain or discomfort.  You develop an additional serious illness.  You have diarrhea for more than 6 hours.  You have been sick or have had a fever for a couple of days and are not getting better. SEEK IMMEDIATE MEDICAL CARE IF:   You have difficulty breathing.  You no longer feel the baby moving.  You are bleeding or have discharge from your vagina.  You start having premature contractions or labor. MAKE SURE YOU:  Understand these instructions.  Will watch your condition.  Will get help right away if you are not doing well or get worse. Document Released: 10/13/2000 Document Revised: 11/21/2013 Document Reviewed: 02/03/2012 [employer] Patient Information 2015 [employer]. This information is not intended to replace advice given to you by your health care provider. Make sure you discuss any questions you have with your health care provider.  

## 2014-12-25 NOTE — Progress Notes (Signed)
Subjective:   Katherine Roman is a 34 y.o. [id number] at 7666w0d being seen today for her obstetrical visit.  Patient reports no complaints.    [facility] 3hr GTT.  She reports no blurred vision, no polyuria or polydipsia.  She did not have GDM with any of her other pregnancies, although she did not get prenatal care with her last pregnancy.  She has glucometer, but has not been checking her blood sugars correctly.  Has not been started on medications.  She has certain LMP with an EDC of 06/06/15.  She had normal vaginal deliveries with her pregnancies, although she had a shoulder dystocia 2 pregnancies ago with a 6#12oz baby. Her last delivery had no shoulder dystocia with a 7#5oz baby.  No other complications with her previous pregnancies.    Contractions: Contractions: Not present.   Vaginal Bleeding Vag. Bleeding: None.   Fetal Movement: Movement: Present.   Denies contractions, vaginal bleeding or leaking of fluid.  Reports good fetal movement.  The following portions of the patient's history were reviewed and updated as appropriate: allergies, current medications, past family history, past medical history, past social history, past surgical history and problem list.   Objective:  BP 102/62 mmHg  Pulse 83  Temp(Src) 98.2 F (36.8 C)  Wt 125 lb (56.7 kg)  LMP 08/30/2014 Fetal Heart Rate: Fetal Heart Rate (bpm): 146  Fundal Height: Fundal Height: 18 cm  Fetal Movement: Movement: Present  Fetal Presentation:    Heart: regular rate, no murmur Lungs: clear to auscultation bilaterally, no wheezing.  Abdomen: Soft, gravid, appropriate for gestational age.  Pain/Pressure: Pain/Pressure: Absent     Extremities: Edema: Edema: None   Urinalysis: Protein: Urine Protein: Negative Glucose: Urine Glucose: Negative Results for orders placed or performed in visit on 12/25/14 (from the past 24 hour(s))  POCT urinalysis dip (device)     Status:  Abnormal   Collection Time: 12/25/14  8:31 AM  Result Value Ref Range   Glucose, UA NEGATIVE NEGATIVE mg/dL   Bilirubin Urine NEGATIVE NEGATIVE   Ketones, ur NEGATIVE NEGATIVE mg/dL   Specific Gravity, Urine 1.025 1.005 - 1.030   Hgb urine dipstick NEGATIVE NEGATIVE   pH 6.5 5.0 - 8.0   Protein, ur NEGATIVE NEGATIVE mg/dL   Urobilinogen, UA 0.2 0.0 - 1.0 mg/dL   Nitrite NEGATIVE NEGATIVE   Leukocytes, UA TRACE (A) NEGATIVE     Assessment and Plan:   Pregnancy:  [id number] at 3866w0d  1. Supervision of high risk pregnancy, antepartum, second trimester Measuring a little larger than dates.  FHR normal. - US OB Detail; Future  2. Pre-existing diabetes mellitus during pregnancy in second trimester - Comprehensive metabolic panel - TSH - HgB A1c - ASA 81mg  24hr urine protein - discussed blood sugar testing, CBG level of control, risks of GDM to pregnancy including increased risk of shoulder dystocia, cesarean section, and stillbirth. If HgA1c > 6.5, refer to opthamology and will need fetal echo.  3. AMA (advanced maternal age) multigravida 35+, second trimester Continue prenatal care.     Preterm labor symptoms: vaginal bleeding, contractions and leaking of fluid reviewed in detail.  Fetal movement precautions reviewed.  Follow up in 1 week.   [name], DO

## 2014-12-25 NOTE — Progress Notes (Signed)
Anatomy U/S scheduled for January 23, 2015 @ 2:00 PM. Phone interpreter used -Burmease Frankey Poot(D Biggs, RN)

## 2014-12-25 NOTE — Progress Notes (Signed)
Nutrition note: 1st visit consult Pt has GDM. Pt has gained 10# @ 4291w5d, which is slightly > expected. Pt brought BS log with her today but all readings are 98. Harriett SineNancy had client check BS in clinic and it appears that client has been putting the strip in the wrong way in the meter so that's why it shows 98 every time she turns it on. Pt reports eating 5-6x/d (6am-meal, 8am-meal, 12pm-snack or meal, 2pm-snack, 4pm-meal, 6pm-snack). Pt reports walking for 15 mins 1-2x/d. Pt is taking a PNV. Pt reports she is BF her almost 36 year old currently - discussed importance of eating enough food & drinking plenty of water to provide for her fetus & BF child. Pt reports no N&V or heartburn. NKFA. Pt received verbal & pictorial education on GDM diet via language line. Harriett SineNancy reviewed proper way to check BS. Discussed portion sizes & importance of checking BS 2 hrs after first bite of each meal. Discussed wt gain goals of 25-35# or 1#/wk. Pt agrees to follow GDM diet with 3 meals & 2-3 snacks/d with proper CHO/ protein combination. Pt has WIC & plans to BF. F/u in 2-4 wks Blondell RevealLaura Carolynn Tuley, MS, RD, LDN, [facility] Campus[facility]CLC

## 2014-12-26 LAB — TSH: TSH: 1.106 u[IU]/mL (ref 0.350–4.500)

## 2014-12-27 ENCOUNTER — Encounter: Payer: Self-pay | Admitting: Family Medicine

## 2015-01-01 ENCOUNTER — Ambulatory Visit (INDEPENDENT_AMBULATORY_CARE_PROVIDER_SITE_OTHER): Payer: Self-pay | Admitting: Obstetrics and Gynecology

## 2015-01-01 ENCOUNTER — Encounter: Payer: Self-pay | Admitting: Obstetrics and Gynecology

## 2015-01-01 VITALS — BP 111/64 | HR 82 | Temp 98.7°F | Wt 127.3 lb

## 2015-01-01 DIAGNOSIS — O09522 Supervision of elderly multigravida, second trimester: Secondary | ICD-10-CM

## 2015-01-01 DIAGNOSIS — O24419 Gestational diabetes mellitus in pregnancy, unspecified control: Secondary | ICD-10-CM

## 2015-01-01 DIAGNOSIS — R7611 Nonspecific reaction to tuberculin skin test without active tuberculosis: Secondary | ICD-10-CM

## 2015-01-01 DIAGNOSIS — O0992 Supervision of high risk pregnancy, unspecified, second trimester: Secondary | ICD-10-CM

## 2015-01-01 LAB — POCT URINALYSIS DIP (DEVICE)
Bilirubin Urine: NEGATIVE
Glucose, UA: NEGATIVE mg/dL
Hgb urine dipstick: NEGATIVE
Ketones, ur: NEGATIVE mg/dL
Leukocytes, UA: NEGATIVE
Nitrite: NEGATIVE
PH: 7 (ref 5.0–8.0)
PROTEIN: NEGATIVE mg/dL
SPECIFIC GRAVITY, URINE: 1.015 (ref 1.005–1.030)
UROBILINOGEN UA: 0.2 mg/dL (ref 0.0–1.0)

## 2015-01-01 MED ORDER — GLYBURIDE 1.25 MG PO TABS: 1.2500 mg | ORAL_TABLET | Freq: Every day | ORAL | Status: DC

## 2015-01-01 NOTE — Progress Notes (Signed)
Pt is not taking baby aspirin\ Language resource as interpreter for encounter Breastfeeding tip of the week reviewed Pt has 24 hour urine today

## 2015-01-01 NOTE — Progress Notes (Signed)
Subjective:  Katherine Roman is a 34 y.o. [id number] at [redacted]w[redacted]d being seen today for ongoing prenatal care.  Patient reports no complaints.  Contractions: Not present.  Vag. Bleeding: None. Movement: Present. Denies leaking of fluid.   The following portions of the patient's history were reviewed and updated as appropriate: allergies, current medications, past family history, past medical history, past social history, past surgical history and problem list.   Objective:   Filed Vitals:   01/01/15 1101  BP: 111/64  Pulse: 82  Temp: 98.7 F (37.1 C)  Weight: 127 lb 4.8 oz (57.743 kg)    Fetal Status: Fetal Heart Rate (bpm): 143   Movement: Present     General:  Alert, oriented and cooperative. Patient is in no acute distress.  Skin: Skin is warm and dry. No rash noted.   Cardiovascular: Normal heart rate noted  Respiratory: Effort and breath sounds normal, no problems with respiration noted  Abdomen: Soft, gravid, appropriate for gestational age. Pain/Pressure: Absent     Vaginal: Vag. Bleeding: None.       Cervix: Not evaluated  Extremities: Normal range of motion.  Edema: None  Mental Status: Normal mood and affect. Normal behavior. Normal judgment and thought content.   Urinalysis:      Assessment and Plan:  Pregnancy: [id number] at [redacted]w[redacted]d  1. Gestational diabetes mellitus in second trimester, unspecified diabetic control Reviewed blood glucose log today and not at goal. Only 2/10 AM fasting at goal. 4/21 postprandials above goal. Currently not on medications. Start glyburide 1.25 mg nightly.  Continue diet/exercise.  Anatomy scan scheduled for 01/23/15.   2. AMA (advanced maternal age) multigravida 35+, second trimester Continue current care.   3. Positive PPD Negative chest x-ray.   4. Supervision of high risk pregnancy, antepartum, second trimester Dx: Gestational diabetes.    Preterm labor symptoms and general obstetric precautions including but not limited to vaginal bleeding,  contractions, leaking of fluid and fetal movement were reviewed in detail with the patient.  Please refer to After Visit Summary for other counseling recommendations.   Return in about 2 weeks (around 01/15/2015).   William Dalton, MD

## 2015-01-02 LAB — COMPREHENSIVE METABOLIC PANEL
ALBUMIN: 3.4 g/dL — AB (ref 3.5–5.2)
ALK PHOS: 59 U/L (ref 39–117)
ALT: <8 U/L (ref 0–35)
AST: 12 U/L (ref 0–37)
BUN: 8 mg/dL (ref 6–23)
CHLORIDE: 104 meq/L (ref 96–112)
CO2: 24 meq/L (ref 19–32)
Calcium: 8.4 mg/dL (ref 8.4–10.5)
Creat: 0.65 mg/dL (ref 0.50–1.10)
GLUCOSE: 95 mg/dL (ref 70–99)
Potassium: 3.9 mEq/L (ref 3.5–5.3)
SODIUM: 136 meq/L (ref 135–145)
TOTAL PROTEIN: 6.4 g/dL (ref 6.0–8.3)
Total Bilirubin: 0.2 mg/dL (ref 0.2–1.2)

## 2015-01-02 LAB — PROTEIN, URINE, 24 HOUR
Protein, 24H Urine: 116 mg/d (ref <150)
Protein, Urine: 11 mg/dL (ref 5–24)

## 2015-01-15 ENCOUNTER — Ambulatory Visit (INDEPENDENT_AMBULATORY_CARE_PROVIDER_SITE_OTHER): Payer: Self-pay | Admitting: Family Medicine

## 2015-01-15 VITALS — BP 113/72 | HR 90 | Temp 98.0°F | Wt 128.6 lb

## 2015-01-15 DIAGNOSIS — O24319 Unspecified pre-existing diabetes mellitus in pregnancy, unspecified trimester: Secondary | ICD-10-CM

## 2015-01-15 DIAGNOSIS — O0992 Supervision of high risk pregnancy, unspecified, second trimester: Secondary | ICD-10-CM

## 2015-01-15 DIAGNOSIS — O09522 Supervision of elderly multigravida, second trimester: Secondary | ICD-10-CM

## 2015-01-15 DIAGNOSIS — O24912 Unspecified diabetes mellitus in pregnancy, second trimester: Secondary | ICD-10-CM

## 2015-01-15 LAB — POCT URINALYSIS DIP (DEVICE)
Bilirubin Urine: NEGATIVE
Glucose, UA: 100 mg/dL — AB
Hgb urine dipstick: NEGATIVE
KETONES UR: NEGATIVE mg/dL
LEUKOCYTES UA: NEGATIVE
Nitrite: NEGATIVE
PH: 7 (ref 5.0–8.0)
Protein, ur: NEGATIVE mg/dL
Specific Gravity, Urine: 1.02 (ref 1.005–1.030)
Urobilinogen, UA: 0.2 mg/dL (ref 0.0–1.0)

## 2015-01-15 NOTE — Patient Instructions (Signed)
Gestational Diabetes Mellitus Gestational diabetes mellitus, often simply referred to as gestational diabetes, is a type of diabetes that some women develop during pregnancy. In gestational diabetes, the pancreas does not make enough insulin (a hormone), the cells are less responsive to the insulin that is made (insulin resistance), or both.Normally, insulin moves sugars from food into the tissue cells. The tissue cells use the sugars for energy. The lack of insulin or the lack of normal response to insulin causes excess sugars to build up in the blood instead of going into the tissue cells. As a result, high blood sugar (hyperglycemia) develops. The effect of high sugar (glucose) levels can cause many problems.  RISK FACTORS You have an increased chance of developing gestational diabetes if you have a family history of diabetes and also have one or more of the following risk factors:  A body mass index over 30 (obesity).  A previous pregnancy with gestational diabetes.  An older age at the time of pregnancy. If blood glucose levels are kept in the normal range during pregnancy, women can have a healthy pregnancy. If your blood glucose levels are not well controlled, there may be risks to you, your unborn baby (fetus), your labor and delivery, or your newborn baby.  SYMPTOMS  If symptoms are experienced, they are much like symptoms you would normally expect during pregnancy. The symptoms of gestational diabetes include:   Increased thirst (polydipsia).  Increased urination (polyuria).  Increased urination during the night (nocturia).  Weight loss. This weight loss may be rapid.  Frequent, recurring infections.  Tiredness (fatigue).  Weakness.  Vision changes, such as blurred vision.  Fruity smell to your breath.  Abdominal pain. DIAGNOSIS Diabetes is diagnosed when blood glucose levels are increased. Your blood glucose level may be checked by one or more of the following blood  tests:  A fasting blood glucose test. You will not be allowed to eat for at least 8 hours before a blood sample is taken.  A random blood glucose test. Your blood glucose is checked at any time of the day regardless of when you ate.  A hemoglobin A1c blood glucose test. A hemoglobin A1c test provides information about blood glucose control over the previous 3 months.  An oral glucose tolerance test (OGTT). Your blood glucose is measured after you have not eaten (fasted) for 1-3 hours and then after you drink a glucose-containing beverage. Since the hormones that cause insulin resistance are highest at about 24-28 weeks of a pregnancy, an OGTT is usually performed during that time. If you have risk factors for gestational diabetes, your health care provider may test you for gestational diabetes earlier than 24 weeks of pregnancy. TREATMENT   You will need to take diabetes medicine or insulin daily to keep blood glucose levels in the desired range.  You will need to match insulin dosing with exercise and healthy food choices. The treatment goal is to maintain the before-meal (preprandial), bedtime, and overnight blood glucose level at 60-99 mg/dL during pregnancy. The treatment goal is to further maintain peak after-meal blood sugar (postprandial glucose) level at 100-140 mg/dL. HOME CARE INSTRUCTIONS   Have your hemoglobin A1c level checked twice a year.  Perform daily blood glucose monitoring as directed by your health care provider. It is common to perform frequent blood glucose monitoring.  Monitor urine ketones when you are ill and as directed by your health care provider.  Take your diabetes medicine and insulin as directed by your health care provider   to maintain your blood glucose level in the desired range.  Never run out of diabetes medicine or insulin. It is needed every day.  Adjust insulin based on your intake of carbohydrates. Carbohydrates can raise blood glucose levels but  need to be included in your diet. Carbohydrates provide vitamins, minerals, and fiber which are an essential part of a healthy diet. Carbohydrates are found in fruits, vegetables, whole grains, dairy products, legumes, and foods containing added sugars.  Eat healthy foods. Alternate 3 meals with 3 snacks.  Maintain a healthy weight gain. The usual total expected weight gain varies according to your prepregnancy body mass index (BMI).  Carry a medical alert card or wear your medical alert jewelry.  Carry a 15-gram carbohydrate snack with you at all times to treat low blood glucose (hypoglycemia). Some examples of 15-gram carbohydrate snacks include:  Glucose tablets, 3 or 4.  Glucose gel, 15-gram tube.  Raisins, 2 tablespoons (24 g).  Jelly beans, 6.  Animal crackers, 8.  Fruit juice, regular soda, or low-fat milk, 4 ounces (120 mL).  Gummy treats, 9.  Recognize hypoglycemia. Hypoglycemia during pregnancy occurs with blood glucose levels of 60 mg/dL and below. The risk for hypoglycemia increases when fasting or skipping meals, during or after intense exercise, and during sleep. Hypoglycemia symptoms can include:  Tremors or shakes.  Decreased ability to concentrate.  Sweating.  Increased heart rate.  Headache.  Dry mouth.  Hunger.  Irritability.  Anxiety.  Restless sleep.  Altered speech or coordination.  Confusion.  Treat hypoglycemia promptly. If you are alert and able to safely swallow, follow the 15:15 rule:  Take 15-20 grams of rapid-acting glucose or carbohydrate. Rapid-acting options include glucose gel, glucose tablets, or 4 ounces (120 mL) of fruit juice, regular soda, or low-fat milk.  Check your blood glucose level 15 minutes after taking the glucose.  Take 15-20 grams more of glucose if the repeat blood glucose level is still 70 mg/dL or below.  Eat a meal or snack within 1 hour once blood glucose levels return to normal.  Be alert to polyuria  (excess urination) and polydipsia (excess thirst) which are early signs of hyperglycemia. An early awareness of hyperglycemia allows for prompt treatment. Treat hyperglycemia as directed by your health care provider.  Engage in at least 30 minutes of physical activity a day or as directed by your health care provider. Ten minutes of physical activity timed 30 minutes after each meal is encouraged to control postprandial blood glucose levels.  Adjust your insulin dosing and food intake as needed if you start a new exercise or sport.  Follow your sick-day plan at any time you are unable to eat or drink as usual.  Avoid tobacco and alcohol use.  Keep all follow-up visits as directed by your health care provider.  Follow the advice of your health care provider regarding your prenatal and post-delivery (postpartum) appointments, meal planning, exercise, medicines, vitamins, blood tests, other medical tests, and physical activities.  Perform daily skin and foot care. Examine your skin and feet daily for cuts, bruises, redness, nail problems, bleeding, blisters, or sores.  Brush your teeth and gums at least twice a day and floss at least once a day. Follow up with your dentist regularly.  Schedule an eye exam during the first trimester of your pregnancy or as directed by your health care provider.  Share your diabetes management plan with your workplace or school.  Stay up-to-date with immunizations.  Learn to manage stress.    Obtain ongoing diabetes education and support as needed.  Learn about and consider breastfeeding your baby.  You should have your blood sugar level checked 6-12 weeks after delivery. This is done with an oral glucose tolerance test (OGTT). SEEK MEDICAL CARE IF:   You are unable to eat food or drink fluids for more than 6 hours.  You have nausea and vomiting for more than 6 hours.  You have a blood glucose level of 200 mg/dL and you have ketones in your  urine.  There is a change in mental status.  You develop vision problems.  You have a persistent headache.  You have upper abdominal pain or discomfort.  You develop an additional serious illness.  You have diarrhea for more than 6 hours.  You have been sick or have had a fever for a couple of days and are not getting better. SEEK IMMEDIATE MEDICAL CARE IF:   You have difficulty breathing.  You no longer feel the baby moving.  You are bleeding or have discharge from your vagina.  You start having premature contractions or labor. MAKE SURE YOU:  Understand these instructions.  Will watch your condition.  Will get help right away if you are not doing well or get worse. Document Released: 10/13/2000 Document Revised: 11/21/2013 Document Reviewed: 02/03/2012 [employer] Patient Information 2015 [employer]. This information is not intended to replace advice given to you by your health care provider. Make sure you discuss any questions you have with your health care provider.  Breastfeeding Deciding to breastfeed is one of the best choices you can make for you and your baby. A change in hormones during pregnancy causes your breast tissue to grow and increases the number and size of your milk ducts. These hormones also allow proteins, sugars, and fats from your blood supply to make breast milk in your milk-producing glands. Hormones prevent breast milk from being released before your baby is born as well as prompt milk flow after birth. Once breastfeeding has begun, thoughts of your baby, as well as his or her sucking or crying, can stimulate the release of milk from your milk-producing glands.  BENEFITS OF BREASTFEEDING For Your Baby  Your first milk (colostrum) helps your baby's digestive system function better.   There are antibodies in your milk that help your baby fight off infections.   Your baby has a lower incidence of asthma, allergies, and sudden infant death  syndrome.   The nutrients in breast milk are better for your baby than infant formulas and are designed uniquely for your baby's needs.   Breast milk improves your baby's brain development.   Your baby is less likely to develop other conditions, such as childhood obesity, asthma, or type 2 diabetes mellitus.  For You   Breastfeeding helps to create a very special bond between you and your baby.   Breastfeeding is convenient. Breast milk is always available at the correct temperature and costs nothing.   Breastfeeding helps to burn calories and helps you lose the weight gained during pregnancy.   Breastfeeding makes your uterus contract to its prepregnancy size faster and slows bleeding (lochia) after you give birth.   Breastfeeding helps to lower your risk of developing type 2 diabetes mellitus, osteoporosis, and breast or ovarian cancer later in life. SIGNS THAT YOUR BABY IS HUNGRY Early Signs of Hunger  Increased alertness or activity.  Stretching.  Movement of the head from side to side.  Movement of the head and opening of the   mouth when the corner of the mouth or cheek is stroked (rooting).  Increased sucking sounds, smacking lips, cooing, sighing, or squeaking.  Hand-to-mouth movements.  Increased sucking of fingers or hands. Late Signs of Hunger  Fussing.  Intermittent crying. Extreme Signs of Hunger Signs of extreme hunger will require calming and consoling before your baby will be able to breastfeed successfully. Do not wait for the following signs of extreme hunger to occur before you initiate breastfeeding:   Restlessness.  A loud, strong cry.   Screaming. BREASTFEEDING BASICS Breastfeeding Initiation  Find a comfortable place to sit or lie down, with your neck and back well supported.  Place a pillow or rolled up blanket under your baby to bring him or her to the level of your breast (if you are seated). Nursing pillows are specially designed  to help support your arms and your baby while you breastfeed.  Make sure that your baby's abdomen is facing your abdomen.   Gently massage your breast. With your fingertips, massage from your chest wall toward your nipple in a circular motion. This encourages milk flow. You may need to continue this action during the feeding if your milk flows slowly.  Support your breast with 4 fingers underneath and your thumb above your nipple. Make sure your fingers are well away from your nipple and your baby's mouth.   Stroke your baby's lips gently with your finger or nipple.   When your baby's mouth is open wide enough, quickly bring your baby to your breast, placing your entire nipple and as much of the colored area around your nipple (areola) as possible into your baby's mouth.   More areola should be visible above your baby's upper lip than below the lower lip.   Your baby's tongue should be between his or her lower gum and your breast.   Ensure that your baby's mouth is correctly positioned around your nipple (latched). Your baby's lips should create a seal on your breast and be turned out (everted).  It is common for your baby to suck about 2-3 minutes in order to start the flow of breast milk. Latching Teaching your baby how to latch on to your breast properly is very important. An improper latch can cause nipple pain and decreased milk supply for you and poor weight gain in your baby. Also, if your baby is not latched onto your nipple properly, he or she may swallow some air during feeding. This can make your baby fussy. Burping your baby when you switch breasts during the feeding can help to get rid of the air. However, teaching your baby to latch on properly is still the best way to prevent fussiness from swallowing air while breastfeeding. Signs that your baby has successfully latched on to your nipple:    Silent tugging or silent sucking, without causing you pain.   Swallowing  heard between every 3-4 sucks.    Muscle movement above and in front of his or her ears while sucking.  Signs that your baby has not successfully latched on to nipple:   Sucking sounds or smacking sounds from your baby while breastfeeding.  Nipple pain. If you think your baby has not latched on correctly, slip your finger into the corner of your baby's mouth to break the suction and place it between your baby's gums. Attempt breastfeeding initiation again. Signs of Successful Breastfeeding Signs from your baby:   A gradual decrease in the number of sucks or complete cessation of sucking.     Falling asleep.   Relaxation of his or her body.   Retention of a small amount of milk in his or her mouth.   Letting go of your breast by himself or herself. Signs from you:  Breasts that have increased in firmness, weight, and size 1-3 hours after feeding.   Breasts that are softer immediately after breastfeeding.  Increased milk volume, as well as a change in milk consistency and color by the fifth day of breastfeeding.   Nipples that are not sore, cracked, or bleeding. Signs That Your Baby is Getting Enough Milk  Wetting at least 3 diapers in a 24-hour period. The urine should be clear and pale yellow by age 5 days.  At least 3 stools in a 24-hour period by age 5 days. The stool should be soft and yellow.  At least 3 stools in a 24-hour period by age 7 days. The stool should be seedy and yellow.  No loss of weight greater than 10% of birth weight during the first 3 days of age.  Average weight gain of 4-7 ounces (113-198 g) per week after age 4 days.  Consistent daily weight gain by age 5 days, without weight loss after the age of 2 weeks. After a feeding, your baby may spit up a small amount. This is common. BREASTFEEDING FREQUENCY AND DURATION Frequent feeding will help you make more milk and can prevent sore nipples and breast engorgement. Breastfeed when you feel the  need to reduce the fullness of your breasts or when your baby shows signs of hunger. This is called "breastfeeding on demand." Avoid introducing a pacifier to your baby while you are working to establish breastfeeding (the first 4-6 weeks after your baby is born). After this time you may choose to use a pacifier. Research has shown that pacifier use during the first year of a baby's life decreases the risk of sudden infant death syndrome (SIDS). Allow your baby to feed on each breast as long as he or she wants. Breastfeed until your baby is finished feeding. When your baby unlatches or falls asleep while feeding from the first breast, offer the second breast. Because newborns are often sleepy in the first few weeks of life, you may need to awaken your baby to get him or her to feed. Breastfeeding times will vary from baby to baby. However, the following rules can serve as a guide to help you ensure that your baby is properly fed:  Newborns (babies 4 weeks of age or younger) may breastfeed every 1-3 hours.  Newborns should not go longer than 3 hours during the day or 5 hours during the night without breastfeeding.  You should breastfeed your baby a minimum of 8 times in a 24-hour period until you begin to introduce solid foods to your baby at around 6 months of age. BREAST MILK PUMPING Pumping and storing breast milk allows you to ensure that your baby is exclusively fed your breast milk, even at times when you are unable to breastfeed. This is especially important if you are going back to work while you are still breastfeeding or when you are not able to be present during feedings. Your lactation consultant can give you guidelines on how long it is safe to store breast milk.  A breast pump is a machine that allows you to pump milk from your breast into a sterile bottle. The pumped breast milk can then be stored in a refrigerator or freezer. Some breast pumps are operated by   hand, while others use  electricity. Ask your lactation consultant which type will work best for you. Breast pumps can be purchased, but some hospitals and breastfeeding support groups lease breast pumps on a monthly basis. A lactation consultant can teach you how to hand express breast milk, if you prefer not to use a pump.  CARING FOR YOUR BREASTS WHILE YOU BREASTFEED Nipples can become dry, cracked, and sore while breastfeeding. The following recommendations can help keep your breasts moisturized and healthy:  Avoid using soap on your nipples.   Wear a supportive bra. Although not required, special nursing bras and tank tops are designed to allow access to your breasts for breastfeeding without taking off your entire bra or top. Avoid wearing underwire-style bras or extremely tight bras.  Air dry your nipples for 3-4minutes after each feeding.   Use only cotton bra pads to absorb leaked breast milk. Leaking of breast milk between feedings is normal.   Use lanolin on your nipples after breastfeeding. Lanolin helps to maintain your skin's normal moisture barrier. If you use pure lanolin, you do not need to wash it off before feeding your baby again. Pure lanolin is not toxic to your baby. You may also hand express a few drops of breast milk and gently massage that milk into your nipples and allow the milk to air dry. In the first few weeks after giving birth, some women experience extremely full breasts (engorgement). Engorgement can make your breasts feel heavy, warm, and tender to the touch. Engorgement peaks within 3-5 days after you give birth. The following recommendations can help ease engorgement:  Completely empty your breasts while breastfeeding or pumping. You may want to start by applying warm, moist heat (in the shower or with warm water-soaked hand towels) just before feeding or pumping. This increases circulation and helps the milk flow. If your baby does not completely empty your breasts while  breastfeeding, pump any extra milk after he or she is finished.  Wear a snug bra (nursing or regular) or tank top for 1-2 days to signal your body to slightly decrease milk production.  Apply ice packs to your breasts, unless this is too uncomfortable for you.  Make sure that your baby is latched on and positioned properly while breastfeeding. If engorgement persists after 48 hours of following these recommendations, contact your health care provider or a lactation consultant. OVERALL HEALTH CARE RECOMMENDATIONS WHILE BREASTFEEDING  Eat healthy foods. Alternate between meals and snacks, eating 3 of each per day. Because what you eat affects your breast milk, some of the foods may make your baby more irritable than usual. Avoid eating these foods if you are sure that they are negatively affecting your baby.  Drink milk, fruit juice, and water to satisfy your thirst (about 10 glasses a day).   Rest often, relax, and continue to take your prenatal vitamins to prevent fatigue, stress, and anemia.  Continue breast self-awareness checks.  Avoid chewing and smoking tobacco.  Avoid alcohol and drug use. Some medicines that may be harmful to your baby can pass through breast milk. It is important to ask your health care provider before taking any medicine, including all over-the-counter and prescription medicine as well as vitamin and herbal supplements. It is possible to become pregnant while breastfeeding. If birth control is desired, ask your health care provider about options that will be safe for your baby. SEEK MEDICAL CARE IF:   You feel like you want to stop breastfeeding or have become   frustrated with breastfeeding.  You have painful breasts or nipples.  Your nipples are cracked or bleeding.  Your breasts are red, tender, or warm.  You have a swollen area on either breast.  You have a fever or chills.  You have nausea or vomiting.  You have drainage other than breast milk from  your nipples.  Your breasts do not become full before feedings by the fifth day after you give birth.  You feel sad and depressed.  Your baby is too sleepy to eat well.  Your baby is having trouble sleeping.   Your baby is wetting less than 3 diapers in a 24-hour period.  Your baby has less than 3 stools in a 24-hour period.  Your baby's skin or the white part of his or her eyes becomes yellow.   Your baby is not gaining weight by 5 days of age. SEEK IMMEDIATE MEDICAL CARE IF:   Your baby is overly tired (lethargic) and does not want to wake up and feed.  Your baby develops an unexplained fever. Document Released: 07/07/2005 Document Revised: 07/12/2013 Document Reviewed: 12/29/2012 [employer] Patient Information 2015 [employer]. This information is not intended to replace advice given to you by your health care provider. Make sure you discuss any questions you have with your health care provider.  

## 2015-01-15 NOTE — Progress Notes (Signed)
FBS 80-103 (3 of 8 out of range) 2 hr pp 75-153 (2 out of range)

## 2015-01-15 NOTE — Progress Notes (Signed)
Subjective:  Katherine Roman is a 34 y.o. [id number] at [redacted]w[redacted]d being seen today for ongoing prenatal care.  Patient reports no complaints.  Contractions: Not present.  Vag. Bleeding: None. Movement: Present. Denies leaking of fluid.   The following portions of the patient's history were reviewed and updated as appropriate: allergies, current medications, past family history, past medical history, past social history, past surgical history and problem list.   Objective:   Filed Vitals:   01/15/15 1106  BP: 113/72  Pulse: 90  Temp: 98 F (36.7 C)  Weight: 128 lb 9.6 oz (58.333 kg)    Fetal Status: Fetal Heart Rate (bpm): 144   Movement: Present     General:  Alert, oriented and cooperative. Patient is in no acute distress.  Skin: Skin is warm and dry. No rash noted.   Cardiovascular: Normal heart rate noted  Respiratory: Normal respiratory effort, no problems with respiration noted  Abdomen: Soft, gravid, appropriate for gestational age. Pain/Pressure: Absent     Vaginal: Vag. Bleeding: None.       Extremities: Normal range of motion.  Edema: None  Mental Status: Normal mood and affect. Normal behavior. Normal judgment and thought content.   Urinalysis:   prot neg gluc 2+   Assessment and Plan:  Pregnancy: [id number] at [redacted]w[redacted]d  1. Supervision of high risk pregnancy, antepartum, second trimester For anatomy next week  2. Pre-existing diabetes mellitus affecting pregnancy, antepartum Continue glyburide  3. AMA (advanced maternal age) multigravida 35+, second trimester Declines genetics   Please refer to After Visit Summary for other counseling recommendations.   Return in 3 weeks (on 02/05/2015).   Reva Bores, MD

## 2015-01-23 ENCOUNTER — Ambulatory Visit (HOSPITAL_COMMUNITY)
Admission: RE | Admit: 2015-01-23 | Discharge: 2015-01-23 | Disposition: A | Discharge status: Home / Self Care | Payer: Medicaid Other | Source: Ambulatory Visit | Attending: Family Medicine | Admitting: Family Medicine

## 2015-01-23 ENCOUNTER — Ambulatory Visit (HOSPITAL_COMMUNITY): Admission: RE | Admit: 2015-01-23 | Payer: Self-pay | Source: Ambulatory Visit

## 2015-01-23 DIAGNOSIS — E119 Type 2 diabetes mellitus without complications: Secondary | ICD-10-CM | POA: Diagnosis not present

## 2015-01-23 DIAGNOSIS — Z3A20 20 weeks gestation of pregnancy: Secondary | ICD-10-CM | POA: Diagnosis not present

## 2015-01-23 DIAGNOSIS — O24112 Pre-existing diabetes mellitus, type 2, in pregnancy, second trimester: Secondary | ICD-10-CM | POA: Insufficient documentation

## 2015-01-23 DIAGNOSIS — Z36 Encounter for antenatal screening of mother: Secondary | ICD-10-CM | POA: Insufficient documentation

## 2015-01-23 DIAGNOSIS — Z8759 Personal history of other complications of pregnancy, childbirth and the puerperium: Secondary | ICD-10-CM

## 2015-01-23 DIAGNOSIS — O24319 Unspecified pre-existing diabetes mellitus in pregnancy, unspecified trimester: Secondary | ICD-10-CM

## 2015-01-23 DIAGNOSIS — O09522 Supervision of elderly multigravida, second trimester: Secondary | ICD-10-CM | POA: Diagnosis not present

## 2015-01-25 ENCOUNTER — Telehealth: Payer: Self-pay | Admitting: *Deleted

## 2015-01-25 DIAGNOSIS — O24319 Unspecified pre-existing diabetes mellitus in pregnancy, unspecified trimester: Secondary | ICD-10-CM

## 2015-01-25 NOTE — Telephone Encounter (Signed)
Ultrasound scheduled for 8/3 at 0930. Will inform patient at her next visit.

## 2015-01-25 NOTE — Telephone Encounter (Signed)
-----   Message from Levie HeritageJacob J Stinson, DO sent at 01/23/2015  6:50 PM EDT ----- Needs repeat US in 4 weeks to complete fetal survey

## 2015-02-05 ENCOUNTER — Ambulatory Visit (INDEPENDENT_AMBULATORY_CARE_PROVIDER_SITE_OTHER): Payer: Medicaid Other | Admitting: Obstetrics & Gynecology

## 2015-02-05 VITALS — BP 106/60 | HR 80 | Temp 98.3°F | Wt 131.1 lb

## 2015-02-05 DIAGNOSIS — O24912 Unspecified diabetes mellitus in pregnancy, second trimester: Secondary | ICD-10-CM

## 2015-02-05 DIAGNOSIS — O0992 Supervision of high risk pregnancy, unspecified, second trimester: Secondary | ICD-10-CM

## 2015-02-05 LAB — POCT URINALYSIS DIP (DEVICE)
Bilirubin Urine: NEGATIVE
Glucose, UA: NEGATIVE mg/dL
HGB URINE DIPSTICK: NEGATIVE
Ketones, ur: NEGATIVE mg/dL
LEUKOCYTES UA: NEGATIVE
Nitrite: NEGATIVE
Protein, ur: NEGATIVE mg/dL
Specific Gravity, Urine: 1.02 (ref 1.005–1.030)
Urobilinogen, UA: 0.2 mg/dL (ref 0.0–1.0)
pH: 7 (ref 5.0–8.0)

## 2015-02-05 MED ORDER — GLYBURIDE 2.5 MG PO TABS: 2.5000 mg | ORAL_TABLET | Freq: Every day | ORAL | Status: DC

## 2015-02-05 NOTE — Progress Notes (Signed)
   Subjective:  Katherine Roman is a 34 y.o. G4W1027G9P8008 at 8262w5d being seen today for ongoing prenatal care.  Patient reports no complaints.  Contractions: Not present.  Vag. Bleeding: None. Movement: Present. Denies leaking of fluid.   The following portions of the patient's history were reviewed and updated as appropriate: allergies, current medications, past family history, past medical history, past social history, past surgical history and problem list.   Objective:   Filed Vitals:   02/05/15 1151  BP: 106/60  Pulse: 80  Temp: 98.3 F (36.8 C)  Weight: 131 lb 1.6 oz (59.467 kg)    Fetal Status: Fetal Heart Rate (bpm): 143   Movement: Present     General:  Alert, oriented and cooperative. Patient is in no acute distress.  Skin: Skin is warm and dry. No rash noted.   Cardiovascular: Normal heart rate noted  Respiratory: Normal respiratory effort, no problems with respiration noted  Abdomen: Soft, gravid, appropriate for gestational age. Pain/Pressure: Present     Vaginal: Vag. Bleeding: None.       Cervix: Not evaluated        Extremities: Normal range of motion.  Edema: None  Mental Status: Normal mood and affect. Normal behavior. Normal judgment and thought content.   Urinalysis: Urine Protein: Negative Urine Glucose: Negative  Assessment and Plan:  Pregnancy: [id number] at 4162w5d  1. Supervision of high risk pregnancy, antepartum, second trimester Needs f/u anatomy - US OB Follow Up; Future  2. Diabetes mellitus complicating pregnancy, antepartum, second trimester All but one fasting high.  Increase glyburide to 2.5 mg QHS. Encouraged to take baby asa  Preterm labor symptoms and general obstetric precautions including but not limited to vaginal bleeding, contractions, leaking of fluid and fetal movement were reviewed in detail with the patient. Please refer to After Visit Summary for other counseling recommendations.  Return in about 1 week (around 02/12/2015).   Lesly DukesKelly H Ailyne Pawley,  MD

## 2015-02-05 NOTE — Progress Notes (Signed)
Georga BoraLay Sha used for interpreter Reviewed tip of week with patient

## 2015-02-21 ENCOUNTER — Ambulatory Visit (HOSPITAL_COMMUNITY): Payer: Medicaid Other

## 2015-02-26 ENCOUNTER — Ambulatory Visit (INDEPENDENT_AMBULATORY_CARE_PROVIDER_SITE_OTHER): Payer: Medicaid Other | Admitting: Obstetrics and Gynecology

## 2015-02-26 VITALS — BP 105/61 | HR 79 | Temp 98.2°F | Wt 136.0 lb

## 2015-02-26 DIAGNOSIS — O24319 Unspecified pre-existing diabetes mellitus in pregnancy, unspecified trimester: Secondary | ICD-10-CM

## 2015-02-26 DIAGNOSIS — O0990 Supervision of high risk pregnancy, unspecified, unspecified trimester: Secondary | ICD-10-CM

## 2015-02-26 DIAGNOSIS — O0992 Supervision of high risk pregnancy, unspecified, second trimester: Secondary | ICD-10-CM | POA: Diagnosis not present

## 2015-02-26 DIAGNOSIS — O24912 Unspecified diabetes mellitus in pregnancy, second trimester: Secondary | ICD-10-CM | POA: Diagnosis not present

## 2015-02-26 LAB — POCT URINALYSIS DIP (DEVICE)
Bilirubin Urine: NEGATIVE
GLUCOSE, UA: NEGATIVE mg/dL
HGB URINE DIPSTICK: NEGATIVE
KETONES UR: NEGATIVE mg/dL
Leukocytes, UA: NEGATIVE
NITRITE: NEGATIVE
PROTEIN: NEGATIVE mg/dL
Specific Gravity, Urine: 1.02 (ref 1.005–1.030)
UROBILINOGEN UA: 0.2 mg/dL (ref 0.0–1.0)
pH: 7 (ref 5.0–8.0)

## 2015-02-26 NOTE — Progress Notes (Signed)
Subjective:  Adaleah Narvaez is a 34 y.o. W0J8119 at [redacted]w[redacted]d being seen today for ongoing prenatal care.  Patient reports no complaints. All fastings are less than 95. Greater than 50% of 2-hour pps are > 120. Contractions: Not present.  Vag. Bleeding: None. Movement: Present. Denies leaking of fluid.   The following portions of the patient's history were reviewed and updated as appropriate: allergies, current medications, past family history, past medical history, past social history, past surgical history and problem list.   Objective:   Filed Vitals:   02/26/15 0957  BP: 105/61  Pulse: 79  Temp: 98.2 F (36.8 C)  Weight: 136 lb (61.689 kg)    Fetal Status: Fetal Heart Rate (bpm): 147   Movement: Present     General:  Alert, oriented and cooperative. Patient is in no acute distress.  Skin: Skin is warm and dry. No rash noted.   Cardiovascular: Normal heart rate noted  Respiratory: Normal respiratory effort, no problems with respiration noted  Abdomen: Soft, gravid, appropriate for gestational age. Pain/Pressure: Absent     Pelvic: Vag. Bleeding: None     Cervical exam deferred        Extremities: Normal range of motion.  Edema: None  Mental Status: Normal mood and affect. Normal behavior. Normal judgment and thought content.   Urinalysis: Urine Protein: Negative Urine Glucose: Negative  Assessment and Plan:  Pregnancy: J4N8295 at [redacted]w[redacted]d  1. Pre-existing diabetes mellitus affecting pregnancy, antepartum - continue glyb 2.5 qhs, start 1.25 in the AM - f/u one week - confirmed nexplanon vs. Depo, breast/bottle feeding  2. Size < dates - also needs f/u anatomy scan - u/s scheduled 8/10 - ROI for pap results, says obtained at [facility] this pregnancy    Preterm labor symptoms and general obstetric precautions including but not limited to vaginal bleeding, contractions, leaking of fluid and fetal movement were reviewed in detail with the patient. Please refer to After Visit Summary for other  counseling recommendations.  Return in about 1 week (around 03/05/2015).   [name], MD

## 2015-02-26 NOTE — Progress Notes (Signed)
Win used for interpreter Reviewed tip of week with patient Patient states when she takes aspirin she feels fatigue/depressed, dizzy and just generally doesn't feel well so she hasn't been taking it

## 2015-02-28 ENCOUNTER — Encounter (HOSPITAL_COMMUNITY): Payer: Self-pay

## 2015-02-28 ENCOUNTER — Other Ambulatory Visit: Payer: Self-pay | Admitting: Family Medicine

## 2015-02-28 ENCOUNTER — Ambulatory Visit (HOSPITAL_COMMUNITY)
Admission: RE | Admit: 2015-02-28 | Discharge: 2015-02-28 | Disposition: A | Discharge status: Home / Self Care | Payer: Medicaid Other | Source: Ambulatory Visit | Attending: Family Medicine | Admitting: Family Medicine

## 2015-02-28 DIAGNOSIS — O24319 Unspecified pre-existing diabetes mellitus in pregnancy, unspecified trimester: Secondary | ICD-10-CM

## 2015-02-28 DIAGNOSIS — O09522 Supervision of elderly multigravida, second trimester: Secondary | ICD-10-CM | POA: Insufficient documentation

## 2015-02-28 DIAGNOSIS — Z3A26 26 weeks gestation of pregnancy: Secondary | ICD-10-CM | POA: Insufficient documentation

## 2015-02-28 DIAGNOSIS — O24919 Unspecified diabetes mellitus in pregnancy, unspecified trimester: Secondary | ICD-10-CM | POA: Insufficient documentation

## 2015-03-01 ENCOUNTER — Other Ambulatory Visit (HOSPITAL_COMMUNITY): Payer: Self-pay | Admitting: *Deleted

## 2015-03-01 DIAGNOSIS — O09522 Supervision of elderly multigravida, second trimester: Secondary | ICD-10-CM

## 2015-03-01 DIAGNOSIS — E119 Type 2 diabetes mellitus without complications: Secondary | ICD-10-CM

## 2015-03-05 ENCOUNTER — Encounter: Payer: Medicaid Other | Attending: Obstetrics and Gynecology | Admitting: *Deleted

## 2015-03-05 ENCOUNTER — Ambulatory Visit (INDEPENDENT_AMBULATORY_CARE_PROVIDER_SITE_OTHER): Payer: Medicaid Other | Admitting: Obstetrics and Gynecology

## 2015-03-05 ENCOUNTER — Other Ambulatory Visit: Payer: Self-pay | Admitting: Obstetrics and Gynecology

## 2015-03-05 VITALS — BP 114/68 | HR 105 | Wt 135.8 lb

## 2015-03-05 DIAGNOSIS — O24319 Unspecified pre-existing diabetes mellitus in pregnancy, unspecified trimester: Secondary | ICD-10-CM

## 2015-03-05 DIAGNOSIS — Z713 Dietary counseling and surveillance: Secondary | ICD-10-CM | POA: Diagnosis not present

## 2015-03-05 DIAGNOSIS — O24419 Gestational diabetes mellitus in pregnancy, unspecified control: Secondary | ICD-10-CM | POA: Insufficient documentation

## 2015-03-05 DIAGNOSIS — O0990 Supervision of high risk pregnancy, unspecified, unspecified trimester: Secondary | ICD-10-CM

## 2015-03-05 DIAGNOSIS — Z23 Encounter for immunization: Secondary | ICD-10-CM | POA: Diagnosis not present

## 2015-03-05 DIAGNOSIS — O24912 Unspecified diabetes mellitus in pregnancy, second trimester: Secondary | ICD-10-CM | POA: Diagnosis not present

## 2015-03-05 DIAGNOSIS — O99019 Anemia complicating pregnancy, unspecified trimester: Secondary | ICD-10-CM | POA: Diagnosis not present

## 2015-03-05 DIAGNOSIS — O0992 Supervision of high risk pregnancy, unspecified, second trimester: Secondary | ICD-10-CM | POA: Diagnosis not present

## 2015-03-05 LAB — POCT URINALYSIS DIP (DEVICE)
BILIRUBIN URINE: NEGATIVE
Glucose, UA: NEGATIVE mg/dL
HGB URINE DIPSTICK: NEGATIVE
KETONES UR: NEGATIVE mg/dL
Leukocytes, UA: NEGATIVE
Nitrite: NEGATIVE
PH: 6 (ref 5.0–8.0)
Protein, ur: 100 mg/dL — AB
Specific Gravity, Urine: >=1.03 (ref 1.005–1.030)
Urobilinogen, UA: 0.2 mg/dL (ref 0.0–1.0)

## 2015-03-05 LAB — CBC
HCT: 28.8 % — ABNORMAL LOW (ref 36.0–46.0)
HEMOGLOBIN: 9.4 g/dL — AB (ref 12.0–15.0)
MCH: 26.2 pg (ref 26.0–34.0)
MCHC: 32.6 g/dL (ref 30.0–36.0)
MCV: 80.2 fL (ref 78.0–100.0)
MPV: 8.7 fL (ref 8.6–12.4)
Platelets: 357 10*3/uL (ref 150–400)
RBC: 3.59 MIL/uL — ABNORMAL LOW (ref 3.87–5.11)
RDW: 13.6 % (ref 11.5–15.5)
WBC: 14.4 10*3/uL — ABNORMAL HIGH (ref 4.0–10.5)

## 2015-03-05 LAB — RPR

## 2015-03-05 MED ORDER — TETANUS-DIPHTH-ACELL PERTUSSIS 5-2.5-18.5 LF-MCG/0.5 IM SUSP
0.5000 mL | Freq: Once | INTRAMUSCULAR | Status: AC
  Administered 2015-03-05: 0.5 mL via INTRAMUSCULAR

## 2015-03-05 MED ORDER — GLYBURIDE 2.5 MG PO TABS: ORAL_TABLET | ORAL | Status: DC

## 2015-03-05 MED ORDER — GLUCOSE BLOOD VI STRP: ORAL_STRIP | Status: DC

## 2015-03-05 MED ORDER — ACCU-CHEK FASTCLIX LANCETS MISC: 1.0000 | Freq: Four times a day (QID) | Status: DC

## 2015-03-05 NOTE — Addendum Note (Signed)
Addended by: Rosendo Gros on: 03/05/2015 12:20 PM   Modules accepted: Orders

## 2015-03-05 NOTE — Progress Notes (Signed)
Used interpreter [employer] States she stopped taking baby aspirin because she couldn't sleep after taking it and made stomach tight. C/o contractions sometimes, not every day.

## 2015-03-05 NOTE — Progress Notes (Signed)
Nurse reports that patient now has Medicaid insurance. Dispensed new glucometer with instruction. Interpreter: [employer] Win Khine assisted with visit. Accu-Chek [employer] Lot# [id number] Exp: 12/19/15 2hpp /dl

## 2015-03-05 NOTE — Progress Notes (Signed)
Subjective:  Katherine Roman is a 34 y.o. [id number] at [redacted]w[redacted]d being seen today for ongoing prenatal care.  Patient reports no complaints. 2 fastings are > 9. Post-prandials are not being performed exactly at 2 hours. 9 of 18 are > 130. Marland Kitchen  Contractions: Irregular.  Vag. Bleeding: None. Movement: Present. Denies leaking of fluid.   The following portions of the patient's history were reviewed and updated as appropriate: allergies, current medications, past family history, past medical history, past social history, past surgical history and problem list.   Objective:   Filed Vitals:   03/05/15 1049  BP: 114/68  Pulse: 105  Weight: 135 lb 12.8 oz (61.598 kg)    Fetal Status: Fetal Heart Rate (bpm): 148 Fundal Height: 27 cm Movement: Present     General:  Alert, oriented and cooperative. Patient is in no acute distress.  Skin: Skin is warm and dry. No rash noted.   Cardiovascular: Normal heart rate noted  Respiratory: Normal respiratory effort, no problems with respiration noted  Abdomen: Soft, gravid, appropriate for gestational age. Pain/Pressure: Absent     Pelvic: Vag. Bleeding: None     Cervical exam deferred        Extremities: Normal range of motion.  Edema: None  Mental Status: Normal mood and affect. Normal behavior. Normal judgment and thought content.   Urinalysis:      Assessment and Plan:  Pregnancy: J4N8295 at [redacted]w[redacted]d  1. Pre-existing diabetes mellitus affecting pregnancy, antepartum - increase morning glyburide from 1.25 to 3.75 and maintain evening at 2.5 - CBC - RPR - HIV antibody (with reflex) - Tdap (BOOSTRIX) injection 0.5 mL; Inject 0.5 mLs into the muscle once. - fetal echo scheduled - serial growth ultrasounds scheduled - f/u 2 wks  Preterm labor symptoms and general obstetric precautions including but not limited to vaginal bleeding, contractions, leaking of fluid and fetal movement were reviewed in detail with the patient. Please refer to After Visit Summary for other  counseling recommendations.  Return in about 2 weeks (around 03/19/2015).   Katherine Running, MD

## 2015-03-06 LAB — HIV ANTIBODY (ROUTINE TESTING W REFLEX): HIV 1&2 Ab, 4th Generation: NONREACTIVE

## 2015-03-06 LAB — FERRITIN: Ferritin: 4 ng/mL — ABNORMAL LOW (ref 10–291)

## 2015-03-07 MED ORDER — FERROUS SULFATE 325 (65 FE) MG PO TABS: 325.0000 mg | ORAL_TABLET | Freq: Two times a day (BID) | ORAL | Status: AC

## 2015-03-07 NOTE — Addendum Note (Signed)
Addended by: Shonna Chock B on: 03/07/2015 05:06 PM   Modules accepted: Orders

## 2015-03-08 ENCOUNTER — Telehealth: Payer: Self-pay

## 2015-03-08 NOTE — Telephone Encounter (Signed)
Per Dr. Ashok Pall, pt is anemic and iron Rx has been sent to her pharmacy.  Called pt with [employer] # [phone] and informed pt that she is anemic and that the provider has sent an Rx for iron to her [facility] pharmacy off [address] and [address].  I also advised pt to take Colace 100 mg bid to help in prevention of constipation.  Pt stated understanding with no further questions.

## 2015-03-19 ENCOUNTER — Encounter: Payer: Self-pay | Admitting: Obstetrics and Gynecology

## 2015-03-19 ENCOUNTER — Ambulatory Visit (INDEPENDENT_AMBULATORY_CARE_PROVIDER_SITE_OTHER): Payer: Medicaid Other | Admitting: Obstetrics and Gynecology

## 2015-03-19 VITALS — BP 110/65 | HR 87 | Temp 98.7°F | Wt 137.9 lb

## 2015-03-19 DIAGNOSIS — O24919 Unspecified diabetes mellitus in pregnancy, unspecified trimester: Secondary | ICD-10-CM | POA: Diagnosis not present

## 2015-03-19 DIAGNOSIS — O0993 Supervision of high risk pregnancy, unspecified, third trimester: Secondary | ICD-10-CM | POA: Diagnosis present

## 2015-03-19 DIAGNOSIS — O24319 Unspecified pre-existing diabetes mellitus in pregnancy, unspecified trimester: Secondary | ICD-10-CM

## 2015-03-19 DIAGNOSIS — Z8759 Personal history of other complications of pregnancy, childbirth and the puerperium: Secondary | ICD-10-CM

## 2015-03-19 DIAGNOSIS — O09523 Supervision of elderly multigravida, third trimester: Secondary | ICD-10-CM | POA: Diagnosis not present

## 2015-03-19 DIAGNOSIS — Z603 Acculturation difficulty: Secondary | ICD-10-CM | POA: Diagnosis not present

## 2015-03-19 LAB — POCT URINALYSIS DIP (DEVICE)
GLUCOSE, UA: NEGATIVE mg/dL
HGB URINE DIPSTICK: NEGATIVE
KETONES UR: NEGATIVE mg/dL
LEUKOCYTES UA: NEGATIVE
Nitrite: NEGATIVE
PROTEIN: 100 mg/dL — AB
SPECIFIC GRAVITY, URINE: 1.02 (ref 1.005–1.030)
UROBILINOGEN UA: 0.2 mg/dL (ref 0.0–1.0)
pH: 7 (ref 5.0–8.0)

## 2015-03-19 NOTE — Progress Notes (Signed)
[employer]preter # [phone] Educated pt on Benefits of Breastfeeding for Baby; education sheet given to pt

## 2015-03-19 NOTE — Progress Notes (Signed)
Subjective:  Katherine Roman is a 34 y.o. [id number] at [redacted]w[redacted]d being seen today for ongoing prenatal care.  Patient reports no complaints.  Contractions: Irregular.  Vag. Bleeding: None. Movement: Present. Denies leaking of fluid.   The following portions of the patient's history were reviewed and updated as appropriate: allergies, current medications, past family history, past medical history, past social history, past surgical history and problem list.   Objective:   Filed Vitals:   03/19/15 1042  BP: 110/65  Pulse: 87  Temp: 98.7 F (37.1 C)  Weight: 137 lb 14.4 oz (62.551 kg)    Fetal Status: Fetal Heart Rate (bpm): 152 Fundal Height: 28 cm Movement: Present     General:  Alert, oriented and cooperative. Patient is in no acute distress.  Skin: Skin is warm and dry. No rash noted.   Cardiovascular: Normal heart rate noted  Respiratory: Normal respiratory effort, no problems with respiration noted  Abdomen: Soft, gravid, appropriate for gestational age. Pain/Pressure: Absent     Pelvic: Vag. Bleeding: None     Cervical exam deferred        Extremities: Normal range of motion.  Edema: None  Mental Status: Normal mood and affect. Normal behavior. Normal judgment and thought content.   Urinalysis:      Assessment and Plan:  Pregnancy: [id number] at [redacted]w[redacted]d  1. Supervision of high risk pregnancy, antepartum, third trimester Patient is doing well without complaints  2. Pre-existing diabetes mellitus affecting pregnancy, antepartum CBGs reviewed and great majority within range. 2 fasting values of 105. Reminded patient to consume a protein rich snack at bedtime Follow up growth ultrasound on 03/28/2015 Fetal echo on 04/03/2015  3. Language barrier, cultural differences   4. History of shoulder dystocia in prior pregnancy   5. AMA (advanced maternal age) multigravida 35+, third trimester   Preterm labor symptoms and general obstetric precautions including but not limited to vaginal bleeding,  contractions, leaking of fluid and fetal movement were reviewed in detail with the patient. Please refer to After Visit Summary for other counseling recommendations.  Return in about 2 weeks (around 04/02/2015).   Catalina Antigua, MD

## 2015-03-28 ENCOUNTER — Other Ambulatory Visit (HOSPITAL_COMMUNITY): Payer: Self-pay | Admitting: *Deleted

## 2015-03-28 ENCOUNTER — Other Ambulatory Visit (HOSPITAL_COMMUNITY): Payer: Self-pay | Admitting: Maternal and Fetal Medicine

## 2015-03-28 ENCOUNTER — Ambulatory Visit (HOSPITAL_COMMUNITY)
Admission: RE | Admit: 2015-03-28 | Discharge: 2015-03-28 | Disposition: A | Discharge status: Home / Self Care | Payer: Medicaid Other | Source: Ambulatory Visit | Attending: Obstetrics and Gynecology | Admitting: Obstetrics and Gynecology

## 2015-03-28 DIAGNOSIS — O09523 Supervision of elderly multigravida, third trimester: Secondary | ICD-10-CM | POA: Diagnosis present

## 2015-03-28 DIAGNOSIS — O24113 Pre-existing diabetes mellitus, type 2, in pregnancy, third trimester: Secondary | ICD-10-CM | POA: Diagnosis not present

## 2015-03-28 DIAGNOSIS — Z3A30 30 weeks gestation of pregnancy: Secondary | ICD-10-CM | POA: Diagnosis not present

## 2015-03-28 DIAGNOSIS — O09522 Supervision of elderly multigravida, second trimester: Secondary | ICD-10-CM

## 2015-04-02 ENCOUNTER — Ambulatory Visit (INDEPENDENT_AMBULATORY_CARE_PROVIDER_SITE_OTHER): Payer: Medicaid Other | Admitting: Obstetrics & Gynecology

## 2015-04-02 VITALS — BP 100/44 | HR 92 | Temp 98.2°F | Wt 139.0 lb

## 2015-04-02 DIAGNOSIS — O0993 Supervision of high risk pregnancy, unspecified, third trimester: Secondary | ICD-10-CM

## 2015-04-02 DIAGNOSIS — Z23 Encounter for immunization: Secondary | ICD-10-CM | POA: Diagnosis not present

## 2015-04-02 LAB — POCT URINALYSIS DIP (DEVICE)
Bilirubin Urine: NEGATIVE
Glucose, UA: NEGATIVE mg/dL
Hgb urine dipstick: NEGATIVE
Ketones, ur: NEGATIVE mg/dL
Leukocytes, UA: NEGATIVE
NITRITE: NEGATIVE
PH: 7 (ref 5.0–8.0)
Protein, ur: NEGATIVE mg/dL
Specific Gravity, Urine: 1.02 (ref 1.005–1.030)
Urobilinogen, UA: 0.2 mg/dL (ref 0.0–1.0)

## 2015-04-02 MED ORDER — GLYBURIDE 2.5 MG PO TABS: ORAL_TABLET | ORAL | Status: DC

## 2015-04-02 MED ORDER — HYDROXYZINE HCL 25 MG PO TABS: 25.0000 mg | ORAL_TABLET | Freq: Four times a day (QID) | ORAL | Status: DC | PRN

## 2015-04-02 NOTE — Progress Notes (Signed)
Subjective:itching all over  Katherine Roman is a 34 y.o. Y7W2956 at [redacted]w[redacted]d being seen today for ongoing prenatal care.  Patient reports itching.  Contractions: Not present.  Vag. Bleeding: None. Movement: Present. Denies leaking of fluid.   The following portions of the patient's history were reviewed and updated as appropriate: allergies, current medications, past family history, past medical history, past social history, past surgical history and problem list.   Objective:   Filed Vitals:   04/02/15 1104  BP: 100/44  Pulse: 92  Temp: 98.2 F (36.8 C)  Weight: 139 lb (63.05 kg)    Fetal Status: Fetal Heart Rate (bpm): 156   Movement: Present     General:  Alert, oriented and cooperative. Patient is in no acute distress.  Skin: Skin is warm and dry. No rash noted.   Cardiovascular: Normal heart rate noted  Respiratory: Normal respiratory effort, no problems with respiration noted  Abdomen: Soft, gravid, appropriate for gestational age. Pain/Pressure: Absent     Pelvic: Vag. Bleeding: None     Cervical exam deferred        Extremities: Normal range of motion.  Edema: None  Mental Status: Normal mood and affect. Normal behavior. Normal judgment and thought content.  No rash but some excoriations from scratching Urinalysis: Urine Protein: Negative Urine Glucose: Negative  Assessment and Plan:  Pregnancy: O1H0865 at [redacted]w[redacted]d Gest DM fair control bu occas 150 PP increase glyburide to 2 in am 1. Flu vaccine need done - Flu Vaccine QUAD 36+ mos IM; Standing - Flu Vaccine QUAD 36+ mos IM  2. Supervision of high risk pregnancy, antepartum, third trimester R/o cholestasis of pregnancy draw bile acids today  Preterm labor symptoms and general obstetric precautions including but not limited to vaginal bleeding, contractions, leaking of fluid and fetal movement were reviewed in detail with the patient. Please refer to After Visit Summary for other counseling recommendations.  Return in about 1 week  (around 04/09/2015).   Adam Phenix, MD

## 2015-04-02 NOTE — Progress Notes (Signed)
[employer] Interpreter ID# [phone] Flu vaccine given Pt complains of entire body itching Breastfeeding tip of the week reviewed

## 2015-04-02 NOTE — Patient Instructions (Signed)
Gestational Diabetes Mellitus Gestational diabetes mellitus, often simply referred to as gestational diabetes, is a type of diabetes that some women develop during pregnancy. In gestational diabetes, the pancreas does not make enough insulin (a hormone), the cells are less responsive to the insulin that is made (insulin resistance), or both.Normally, insulin moves sugars from food into the tissue cells. The tissue cells use the sugars for energy. The lack of insulin or the lack of normal response to insulin causes excess sugars to build up in the blood instead of going into the tissue cells. As a result, high blood sugar (hyperglycemia) develops. The effect of high sugar (glucose) levels can cause many problems.  RISK FACTORS You have an increased chance of developing gestational diabetes if you have a family history of diabetes and also have one or more of the following risk factors:  A body mass index over 30 (obesity).  A previous pregnancy with gestational diabetes.  An older age at the time of pregnancy. If blood glucose levels are kept in the normal range during pregnancy, women can have a healthy pregnancy. If your blood glucose levels are not well controlled, there may be risks to you, your unborn baby (fetus), your labor and delivery, or your newborn baby.  SYMPTOMS  If symptoms are experienced, they are much like symptoms you would normally expect during pregnancy. The symptoms of gestational diabetes include:   Increased thirst (polydipsia).  Increased urination (polyuria).  Increased urination during the night (nocturia).  Weight loss. This weight loss may be rapid.  Frequent, recurring infections.  Tiredness (fatigue).  Weakness.  Vision changes, such as blurred vision.  Fruity smell to your breath.  Abdominal pain. DIAGNOSIS Diabetes is diagnosed when blood glucose levels are increased. Your blood glucose level may be checked by one or more of the following blood  tests:  A fasting blood glucose test. You will not be allowed to eat for at least 8 hours before a blood sample is taken.  A random blood glucose test. Your blood glucose is checked at any time of the day regardless of when you ate.  A hemoglobin A1c blood glucose test. A hemoglobin A1c test provides information about blood glucose control over the previous 3 months.  An oral glucose tolerance test (OGTT). Your blood glucose is measured after you have not eaten (fasted) for 1-3 hours and then after you drink a glucose-containing beverage. Since the hormones that cause insulin resistance are highest at about 24-28 weeks of a pregnancy, an OGTT is usually performed during that time. If you have risk factors for gestational diabetes, your health care provider may test you for gestational diabetes earlier than 24 weeks of pregnancy. TREATMENT   You will need to take diabetes medicine or insulin daily to keep blood glucose levels in the desired range.  You will need to match insulin dosing with exercise and healthy food choices. The treatment goal is to maintain the before-meal (preprandial), bedtime, and overnight blood glucose level at 60-99 mg/dL during pregnancy. The treatment goal is to further maintain peak after-meal blood sugar (postprandial glucose) level at 100-140 mg/dL. HOME CARE INSTRUCTIONS   Have your hemoglobin A1c level checked twice a year.  Perform daily blood glucose monitoring as directed by your health care provider. It is common to perform frequent blood glucose monitoring.  Monitor urine ketones when you are ill and as directed by your health care provider.  Take your diabetes medicine and insulin as directed by your health care provider   to maintain your blood glucose level in the desired range.  Never run out of diabetes medicine or insulin. It is needed every day.  Adjust insulin based on your intake of carbohydrates. Carbohydrates can raise blood glucose levels but  need to be included in your diet. Carbohydrates provide vitamins, minerals, and fiber which are an essential part of a healthy diet. Carbohydrates are found in fruits, vegetables, whole grains, dairy products, legumes, and foods containing added sugars.  Eat healthy foods. Alternate 3 meals with 3 snacks.  Maintain a healthy weight gain. The usual total expected weight gain varies according to your prepregnancy body mass index (BMI).  Carry a medical alert card or wear your medical alert jewelry.  Carry a 15-gram carbohydrate snack with you at all times to treat low blood glucose (hypoglycemia). Some examples of 15-gram carbohydrate snacks include:  Glucose tablets, 3 or 4.  Glucose gel, 15-gram tube.  Raisins, 2 tablespoons (24 g).  Jelly beans, 6.  Animal crackers, 8.  Fruit juice, regular soda, or low-fat milk, 4 ounces (120 mL).  Gummy treats, 9.  Recognize hypoglycemia. Hypoglycemia during pregnancy occurs with blood glucose levels of 60 mg/dL and below. The risk for hypoglycemia increases when fasting or skipping meals, during or after intense exercise, and during sleep. Hypoglycemia symptoms can include:  Tremors or shakes.  Decreased ability to concentrate.  Sweating.  Increased heart rate.  Headache.  Dry mouth.  Hunger.  Irritability.  Anxiety.  Restless sleep.  Altered speech or coordination.  Confusion.  Treat hypoglycemia promptly. If you are alert and able to safely swallow, follow the 15:15 rule:  Take 15-20 grams of rapid-acting glucose or carbohydrate. Rapid-acting options include glucose gel, glucose tablets, or 4 ounces (120 mL) of fruit juice, regular soda, or low-fat milk.  Check your blood glucose level 15 minutes after taking the glucose.  Take 15-20 grams more of glucose if the repeat blood glucose level is still 70 mg/dL or below.  Eat a meal or snack within 1 hour once blood glucose levels return to normal.  Be alert to polyuria  (excess urination) and polydipsia (excess thirst) which are early signs of hyperglycemia. An early awareness of hyperglycemia allows for prompt treatment. Treat hyperglycemia as directed by your health care provider.  Engage in at least 30 minutes of physical activity a day or as directed by your health care provider. Ten minutes of physical activity timed 30 minutes after each meal is encouraged to control postprandial blood glucose levels.  Adjust your insulin dosing and food intake as needed if you start a new exercise or sport.  Follow your sick-day plan at any time you are unable to eat or drink as usual.  Avoid tobacco and alcohol use.  Keep all follow-up visits as directed by your health care provider.  Follow the advice of your health care provider regarding your prenatal and post-delivery (postpartum) appointments, meal planning, exercise, medicines, vitamins, blood tests, other medical tests, and physical activities.  Perform daily skin and foot care. Examine your skin and feet daily for cuts, bruises, redness, nail problems, bleeding, blisters, or sores.  Brush your teeth and gums at least twice a day and floss at least once a day. Follow up with your dentist regularly.  Schedule an eye exam during the first trimester of your pregnancy or as directed by your health care provider.  Share your diabetes management plan with your workplace or school.  Stay up-to-date with immunizations.  Learn to manage stress.    Obtain ongoing diabetes education and support as needed.  Learn about and consider breastfeeding your baby.  You should have your blood sugar level checked 6-12 weeks after delivery. This is done with an oral glucose tolerance test (OGTT). SEEK MEDICAL CARE IF:   You are unable to eat food or drink fluids for more than 6 hours.  You have nausea and vomiting for more than 6 hours.  You have a blood glucose level of 200 mg/dL and you have ketones in your  urine.  There is a change in mental status.  You develop vision problems.  You have a persistent headache.  You have upper abdominal pain or discomfort.  You develop an additional serious illness.  You have diarrhea for more than 6 hours.  You have been sick or have had a fever for a couple of days and are not getting better. SEEK IMMEDIATE MEDICAL CARE IF:   You have difficulty breathing.  You no longer feel the baby moving.  You are bleeding or have discharge from your vagina.  You start having premature contractions or labor. MAKE SURE YOU:  Understand these instructions.  Will watch your condition.  Will get help right away if you are not doing well or get worse. Document Released: 10/13/2000 Document Revised: 11/21/2013 Document Reviewed: 02/03/2012 [employer] Patient Information 2015 [employer]. This information is not intended to replace advice given to you by your health care provider. Make sure you discuss any questions you have with your health care provider.  

## 2015-04-03 LAB — BILE ACIDS, TOTAL: Bile Acids Total: 9 umol/L (ref 0–19)

## 2015-04-09 ENCOUNTER — Encounter: Payer: Medicaid Other | Admitting: Obstetrics and Gynecology

## 2015-04-16 ENCOUNTER — Encounter: Payer: Self-pay | Admitting: Family Medicine

## 2015-04-16 ENCOUNTER — Ambulatory Visit (INDEPENDENT_AMBULATORY_CARE_PROVIDER_SITE_OTHER): Payer: Medicaid Other | Admitting: Family Medicine

## 2015-04-16 VITALS — BP 107/73 | HR 92 | Temp 98.2°F | Wt 140.0 lb

## 2015-04-16 DIAGNOSIS — O0943 Supervision of pregnancy with grand multiparity, third trimester: Secondary | ICD-10-CM | POA: Diagnosis not present

## 2015-04-16 DIAGNOSIS — O24319 Unspecified pre-existing diabetes mellitus in pregnancy, unspecified trimester: Secondary | ICD-10-CM

## 2015-04-16 DIAGNOSIS — O24913 Unspecified diabetes mellitus in pregnancy, third trimester: Secondary | ICD-10-CM

## 2015-04-16 DIAGNOSIS — N898 Other specified noninflammatory disorders of vagina: Secondary | ICD-10-CM | POA: Diagnosis not present

## 2015-04-16 DIAGNOSIS — O0993 Supervision of high risk pregnancy, unspecified, third trimester: Secondary | ICD-10-CM | POA: Diagnosis present

## 2015-04-16 DIAGNOSIS — O0940 Supervision of pregnancy with grand multiparity, unspecified trimester: Secondary | ICD-10-CM | POA: Insufficient documentation

## 2015-04-16 LAB — POCT URINALYSIS DIP (DEVICE)
Bilirubin Urine: NEGATIVE
Glucose, UA: NEGATIVE mg/dL
HGB URINE DIPSTICK: NEGATIVE
Leukocytes, UA: NEGATIVE
Nitrite: NEGATIVE
PH: 6.5 (ref 5.0–8.0)
PROTEIN: 30 mg/dL — AB
Specific Gravity, Urine: 1.02 (ref 1.005–1.030)
Urobilinogen, UA: 0.2 mg/dL (ref 0.0–1.0)

## 2015-04-16 MED ORDER — GLYBURIDE 2.5 MG PO TABS: ORAL_TABLET | ORAL | Status: DC

## 2015-04-16 NOTE — Progress Notes (Signed)
Subjective:  Katherine Roman is a 34 y.o. [id number] at [redacted]w[redacted]d being seen today for ongoing prenatal care.  Patient reports no complaints.  Contractions: Irregular.  Vag. Bleeding: None. Movement: Present. Denies leaking of fluid.   The following portions of the patient's history were reviewed and updated as appropriate: allergies, current medications, past family history, past medical history, past social history, past surgical history and problem list.   Objective:   Filed Vitals:   04/16/15 1114  BP: 107/73  Pulse: 92  Temp: 98.2 F (36.8 C)  Weight: 140 lb (63.504 kg)    Fetal Status: Fetal Heart Rate (bpm): 135 Fundal Height: 36 cm Movement: Present     General:  Alert, oriented and cooperative. Patient is in no acute distress.  Skin: Skin is warm and dry. No rash noted.   Cardiovascular: Normal heart rate noted  Respiratory: Normal respiratory effort, no problems with respiration noted  Abdomen: Soft, gravid, appropriate for gestational age. Pain/Pressure: Present     Pelvic: Vag. Bleeding: None Vag D/C Character: White   Cervical exam deferred        Extremities: Normal range of motion.  Edema: None  Mental Status: Normal mood and affect. Normal behavior. Normal judgment and thought content.   Urinalysis: Urine Protein: 1+ Urine Glucose: Negative FBS 92-108 2 hour pp 76-203 many are out of range. Reports being out of medication for the last 1 month--having trouble getting it filled. Assessment and Plan:  Pregnancy: [id number] at [redacted]w[redacted]d  1. Supervision of high risk pregnancy, antepartum, third trimester Continue routine prenatal care.  2. Pre-existing diabetes mellitus affecting pregnancy, antepartum Continue meds--refilled and bring her AVS to the pharmacy Begin 2x/wk testing this week U/s for growth next week. - glyBURIDE (DIABETA) 2.5 MG tablet; 2 tabs in the morning and 1 tab at night  Dispense: 90 tablet; Refill: 3  3. Vaginal discharge - Wet prep, genital  Preterm labor  symptoms and general obstetric precautions including but not limited to vaginal bleeding, contractions, leaking of fluid and fetal movement were reviewed in detail with the patient. Please refer to After Visit Summary for other counseling recommendations.  Return in 1 week (on 04/23/2015).   Reva Bores, MD

## 2015-04-16 NOTE — Patient Instructions (Signed)
Gestational Diabetes Mellitus Gestational diabetes mellitus, often simply referred to as gestational diabetes, is a type of diabetes that some women develop during pregnancy. In gestational diabetes, the pancreas does not make enough insulin (a hormone), the cells are less responsive to the insulin that is made (insulin resistance), or both.Normally, insulin moves sugars from food into the tissue cells. The tissue cells use the sugars for energy. The lack of insulin or the lack of normal response to insulin causes excess sugars to build up in the blood instead of going into the tissue cells. As a result, high blood sugar (hyperglycemia) develops. The effect of high sugar (glucose) levels can cause many problems.  RISK FACTORS You have an increased chance of developing gestational diabetes if you have a family history of diabetes and also have one or more of the following risk factors:  A body mass index over 30 (obesity).  A previous pregnancy with gestational diabetes.  An older age at the time of pregnancy. If blood glucose levels are kept in the normal range during pregnancy, women can have a healthy pregnancy. If your blood glucose levels are not well controlled, there may be risks to you, your unborn baby (fetus), your labor and delivery, or your newborn baby.  SYMPTOMS  If symptoms are experienced, they are much like symptoms you would normally expect during pregnancy. The symptoms of gestational diabetes include:   Increased thirst (polydipsia).  Increased urination (polyuria).  Increased urination during the night (nocturia).  Weight loss. This weight loss may be rapid.  Frequent, recurring infections.  Tiredness (fatigue).  Weakness.  Vision changes, such as blurred vision.  Fruity smell to your breath.  Abdominal pain. DIAGNOSIS Diabetes is diagnosed when blood glucose levels are increased. Your blood glucose level may be checked by one or more of the following blood  tests:  A fasting blood glucose test. You will not be allowed to eat for at least 8 hours before a blood sample is taken.  A random blood glucose test. Your blood glucose is checked at any time of the day regardless of when you ate.  A hemoglobin A1c blood glucose test. A hemoglobin A1c test provides information about blood glucose control over the previous 3 months.  An oral glucose tolerance test (OGTT). Your blood glucose is measured after you have not eaten (fasted) for 1-3 hours and then after you drink a glucose-containing beverage. Since the hormones that cause insulin resistance are highest at about 24-28 weeks of a pregnancy, an OGTT is usually performed during that time. If you have risk factors for gestational diabetes, your health care provider may test you for gestational diabetes earlier than 24 weeks of pregnancy. TREATMENT   You will need to take diabetes medicine or insulin daily to keep blood glucose levels in the desired range.  You will need to match insulin dosing with exercise and healthy food choices. The treatment goal is to maintain the before-meal (preprandial), bedtime, and overnight blood glucose level at 60-99 mg/dL during pregnancy. The treatment goal is to further maintain peak after-meal blood sugar (postprandial glucose) level at 100-140 mg/dL. HOME CARE INSTRUCTIONS   Have your hemoglobin A1c level checked twice a year.  Perform daily blood glucose monitoring as directed by your health care provider. It is common to perform frequent blood glucose monitoring.  Monitor urine ketones when you are ill and as directed by your health care provider.  Take your diabetes medicine and insulin as directed by your health care provider   to maintain your blood glucose level in the desired range.  Never run out of diabetes medicine or insulin. It is needed every day.  Adjust insulin based on your intake of carbohydrates. Carbohydrates can raise blood glucose levels but  need to be included in your diet. Carbohydrates provide vitamins, minerals, and fiber which are an essential part of a healthy diet. Carbohydrates are found in fruits, vegetables, whole grains, dairy products, legumes, and foods containing added sugars.  Eat healthy foods. Alternate 3 meals with 3 snacks.  Maintain a healthy weight gain. The usual total expected weight gain varies according to your prepregnancy body mass index (BMI).  Carry a medical alert card or wear your medical alert jewelry.  Carry a 15-gram carbohydrate snack with you at all times to treat low blood glucose (hypoglycemia). Some examples of 15-gram carbohydrate snacks include:  Glucose tablets, 3 or 4.  Glucose gel, 15-gram tube.  Raisins, 2 tablespoons (24 g).  Jelly beans, 6.  Animal crackers, 8.  Fruit juice, regular soda, or low-fat milk, 4 ounces (120 mL).  Gummy treats, 9.  Recognize hypoglycemia. Hypoglycemia during pregnancy occurs with blood glucose levels of 60 mg/dL and below. The risk for hypoglycemia increases when fasting or skipping meals, during or after intense exercise, and during sleep. Hypoglycemia symptoms can include:  Tremors or shakes.  Decreased ability to concentrate.  Sweating.  Increased heart rate.  Headache.  Dry mouth.  Hunger.  Irritability.  Anxiety.  Restless sleep.  Altered speech or coordination.  Confusion.  Treat hypoglycemia promptly. If you are alert and able to safely swallow, follow the 15:15 rule:  Take 15-20 grams of rapid-acting glucose or carbohydrate. Rapid-acting options include glucose gel, glucose tablets, or 4 ounces (120 mL) of fruit juice, regular soda, or low-fat milk.  Check your blood glucose level 15 minutes after taking the glucose.  Take 15-20 grams more of glucose if the repeat blood glucose level is still 70 mg/dL or below.  Eat a meal or snack within 1 hour once blood glucose levels return to normal.  Be alert to polyuria  (excess urination) and polydipsia (excess thirst) which are early signs of hyperglycemia. An early awareness of hyperglycemia allows for prompt treatment. Treat hyperglycemia as directed by your health care provider.  Engage in at least 30 minutes of physical activity a day or as directed by your health care provider. Ten minutes of physical activity timed 30 minutes after each meal is encouraged to control postprandial blood glucose levels.  Adjust your insulin dosing and food intake as needed if you start a new exercise or sport.  Follow your sick-day plan at any time you are unable to eat or drink as usual.  Avoid tobacco and alcohol use.  Keep all follow-up visits as directed by your health care provider.  Follow the advice of your health care provider regarding your prenatal and post-delivery (postpartum) appointments, meal planning, exercise, medicines, vitamins, blood tests, other medical tests, and physical activities.  Perform daily skin and foot care. Examine your skin and feet daily for cuts, bruises, redness, nail problems, bleeding, blisters, or sores.  Brush your teeth and gums at least twice a day and floss at least once a day. Follow up with your dentist regularly.  Schedule an eye exam during the first trimester of your pregnancy or as directed by your health care provider.  Share your diabetes management plan with your workplace or school.  Stay up-to-date with immunizations.  Learn to manage stress.    Obtain ongoing diabetes education and support as needed.  Learn about and consider breastfeeding your baby.  You should have your blood sugar level checked 6-12 weeks after delivery. This is done with an oral glucose tolerance test (OGTT). SEEK MEDICAL CARE IF:   You are unable to eat food or drink fluids for more than 6 hours.  You have nausea and vomiting for more than 6 hours.  You have a blood glucose level of 200 mg/dL and you have ketones in your  urine.  There is a change in mental status.  You develop vision problems.  You have a persistent headache.  You have upper abdominal pain or discomfort.  You develop an additional serious illness.  You have diarrhea for more than 6 hours.  You have been sick or have had a fever for a couple of days and are not getting better. SEEK IMMEDIATE MEDICAL CARE IF:   You have difficulty breathing.  You no longer feel the baby moving.  You are bleeding or have discharge from your vagina.  You start having premature contractions or labor. MAKE SURE YOU:  Understand these instructions.  Will watch your condition.  Will get help right away if you are not doing well or get worse. Document Released: 10/13/2000 Document Revised: 11/21/2013 Document Reviewed: 02/03/2012 [employer] Patient Information 2015 [employer]. This information is not intended to replace advice given to you by your health care provider. Make sure you discuss any questions you have with your health care provider.  Breastfeeding Deciding to breastfeed is one of the best choices you can make for you and your baby. A change in hormones during pregnancy causes your breast tissue to grow and increases the number and size of your milk ducts. These hormones also allow proteins, sugars, and fats from your blood supply to make breast milk in your milk-producing glands. Hormones prevent breast milk from being released before your baby is born as well as prompt milk flow after birth. Once breastfeeding has begun, thoughts of your baby, as well as his or her sucking or crying, can stimulate the release of milk from your milk-producing glands.  BENEFITS OF BREASTFEEDING For Your Baby  Your first milk (colostrum) helps your baby's digestive system function better.   There are antibodies in your milk that help your baby fight off infections.   Your baby has a lower incidence of asthma, allergies, and sudden infant death  syndrome.   The nutrients in breast milk are better for your baby than infant formulas and are designed uniquely for your baby's needs.   Breast milk improves your baby's brain development.   Your baby is less likely to develop other conditions, such as childhood obesity, asthma, or type 2 diabetes mellitus.  For You   Breastfeeding helps to create a very special bond between you and your baby.   Breastfeeding is convenient. Breast milk is always available at the correct temperature and costs nothing.   Breastfeeding helps to burn calories and helps you lose the weight gained during pregnancy.   Breastfeeding makes your uterus contract to its prepregnancy size faster and slows bleeding (lochia) after you give birth.   Breastfeeding helps to lower your risk of developing type 2 diabetes mellitus, osteoporosis, and breast or ovarian cancer later in life. SIGNS THAT YOUR BABY IS HUNGRY Early Signs of Hunger  Increased alertness or activity.  Stretching.  Movement of the head from side to side.  Movement of the head and opening of the   mouth when the corner of the mouth or cheek is stroked (rooting).  Increased sucking sounds, smacking lips, cooing, sighing, or squeaking.  Hand-to-mouth movements.  Increased sucking of fingers or hands. Late Signs of Hunger  Fussing.  Intermittent crying. Extreme Signs of Hunger Signs of extreme hunger will require calming and consoling before your baby will be able to breastfeed successfully. Do not wait for the following signs of extreme hunger to occur before you initiate breastfeeding:   Restlessness.  A loud, strong cry.   Screaming. BREASTFEEDING BASICS Breastfeeding Initiation  Find a comfortable place to sit or lie down, with your neck and back well supported.  Place a pillow or rolled up blanket under your baby to bring him or her to the level of your breast (if you are seated). Nursing pillows are specially designed  to help support your arms and your baby while you breastfeed.  Make sure that your baby's abdomen is facing your abdomen.   Gently massage your breast. With your fingertips, massage from your chest wall toward your nipple in a circular motion. This encourages milk flow. You may need to continue this action during the feeding if your milk flows slowly.  Support your breast with 4 fingers underneath and your thumb above your nipple. Make sure your fingers are well away from your nipple and your baby's mouth.   Stroke your baby's lips gently with your finger or nipple.   When your baby's mouth is open wide enough, quickly bring your baby to your breast, placing your entire nipple and as much of the colored area around your nipple (areola) as possible into your baby's mouth.   More areola should be visible above your baby's upper lip than below the lower lip.   Your baby's tongue should be between his or her lower gum and your breast.   Ensure that your baby's mouth is correctly positioned around your nipple (latched). Your baby's lips should create a seal on your breast and be turned out (everted).  It is common for your baby to suck about 2-3 minutes in order to start the flow of breast milk. Latching Teaching your baby how to latch on to your breast properly is very important. An improper latch can cause nipple pain and decreased milk supply for you and poor weight gain in your baby. Also, if your baby is not latched onto your nipple properly, he or she may swallow some air during feeding. This can make your baby fussy. Burping your baby when you switch breasts during the feeding can help to get rid of the air. However, teaching your baby to latch on properly is still the best way to prevent fussiness from swallowing air while breastfeeding. Signs that your baby has successfully latched on to your nipple:    Silent tugging or silent sucking, without causing you pain.   Swallowing  heard between every 3-4 sucks.    Muscle movement above and in front of his or her ears while sucking.  Signs that your baby has not successfully latched on to nipple:   Sucking sounds or smacking sounds from your baby while breastfeeding.  Nipple pain. If you think your baby has not latched on correctly, slip your finger into the corner of your baby's mouth to break the suction and place it between your baby's gums. Attempt breastfeeding initiation again. Signs of Successful Breastfeeding Signs from your baby:   A gradual decrease in the number of sucks or complete cessation of sucking.     Falling asleep.   Relaxation of his or her body.   Retention of a small amount of milk in his or her mouth.   Letting go of your breast by himself or herself. Signs from you:  Breasts that have increased in firmness, weight, and size 1-3 hours after feeding.   Breasts that are softer immediately after breastfeeding.  Increased milk volume, as well as a change in milk consistency and color by the fifth day of breastfeeding.   Nipples that are not sore, cracked, or bleeding. Signs That Your Baby is Getting Enough Milk  Wetting at least 3 diapers in a 24-hour period. The urine should be clear and pale yellow by age 5 days.  At least 3 stools in a 24-hour period by age 5 days. The stool should be soft and yellow.  At least 3 stools in a 24-hour period by age 7 days. The stool should be seedy and yellow.  No loss of weight greater than 10% of birth weight during the first 3 days of age.  Average weight gain of 4-7 ounces (113-198 g) per week after age 4 days.  Consistent daily weight gain by age 5 days, without weight loss after the age of 2 weeks. After a feeding, your baby may spit up a small amount. This is common. BREASTFEEDING FREQUENCY AND DURATION Frequent feeding will help you make more milk and can prevent sore nipples and breast engorgement. Breastfeed when you feel the  need to reduce the fullness of your breasts or when your baby shows signs of hunger. This is called "breastfeeding on demand." Avoid introducing a pacifier to your baby while you are working to establish breastfeeding (the first 4-6 weeks after your baby is born). After this time you may choose to use a pacifier. Research has shown that pacifier use during the first year of a baby's life decreases the risk of sudden infant death syndrome (SIDS). Allow your baby to feed on each breast as long as he or she wants. Breastfeed until your baby is finished feeding. When your baby unlatches or falls asleep while feeding from the first breast, offer the second breast. Because newborns are often sleepy in the first few weeks of life, you may need to awaken your baby to get him or her to feed. Breastfeeding times will vary from baby to baby. However, the following rules can serve as a guide to help you ensure that your baby is properly fed:  Newborns (babies 4 weeks of age or younger) may breastfeed every 1-3 hours.  Newborns should not go longer than 3 hours during the day or 5 hours during the night without breastfeeding.  You should breastfeed your baby a minimum of 8 times in a 24-hour period until you begin to introduce solid foods to your baby at around 6 months of age. BREAST MILK PUMPING Pumping and storing breast milk allows you to ensure that your baby is exclusively fed your breast milk, even at times when you are unable to breastfeed. This is especially important if you are going back to work while you are still breastfeeding or when you are not able to be present during feedings. Your lactation consultant can give you guidelines on how long it is safe to store breast milk.  A breast pump is a machine that allows you to pump milk from your breast into a sterile bottle. The pumped breast milk can then be stored in a refrigerator or freezer. Some breast pumps are operated by   hand, while others use  electricity. Ask your lactation consultant which type will work best for you. Breast pumps can be purchased, but some hospitals and breastfeeding support groups lease breast pumps on a monthly basis. A lactation consultant can teach you how to hand express breast milk, if you prefer not to use a pump.  CARING FOR YOUR BREASTS WHILE YOU BREASTFEED Nipples can become dry, cracked, and sore while breastfeeding. The following recommendations can help keep your breasts moisturized and healthy:  Avoid using soap on your nipples.   Wear a supportive bra. Although not required, special nursing bras and tank tops are designed to allow access to your breasts for breastfeeding without taking off your entire bra or top. Avoid wearing underwire-style bras or extremely tight bras.  Air dry your nipples for 3-4minutes after each feeding.   Use only cotton bra pads to absorb leaked breast milk. Leaking of breast milk between feedings is normal.   Use lanolin on your nipples after breastfeeding. Lanolin helps to maintain your skin's normal moisture barrier. If you use pure lanolin, you do not need to wash it off before feeding your baby again. Pure lanolin is not toxic to your baby. You may also hand express a few drops of breast milk and gently massage that milk into your nipples and allow the milk to air dry. In the first few weeks after giving birth, some women experience extremely full breasts (engorgement). Engorgement can make your breasts feel heavy, warm, and tender to the touch. Engorgement peaks within 3-5 days after you give birth. The following recommendations can help ease engorgement:  Completely empty your breasts while breastfeeding or pumping. You may want to start by applying warm, moist heat (in the shower or with warm water-soaked hand towels) just before feeding or pumping. This increases circulation and helps the milk flow. If your baby does not completely empty your breasts while  breastfeeding, pump any extra milk after he or she is finished.  Wear a snug bra (nursing or regular) or tank top for 1-2 days to signal your body to slightly decrease milk production.  Apply ice packs to your breasts, unless this is too uncomfortable for you.  Make sure that your baby is latched on and positioned properly while breastfeeding. If engorgement persists after 48 hours of following these recommendations, contact your health care provider or a lactation consultant. OVERALL HEALTH CARE RECOMMENDATIONS WHILE BREASTFEEDING  Eat healthy foods. Alternate between meals and snacks, eating 3 of each per day. Because what you eat affects your breast milk, some of the foods may make your baby more irritable than usual. Avoid eating these foods if you are sure that they are negatively affecting your baby.  Drink milk, fruit juice, and water to satisfy your thirst (about 10 glasses a day).   Rest often, relax, and continue to take your prenatal vitamins to prevent fatigue, stress, and anemia.  Continue breast self-awareness checks.  Avoid chewing and smoking tobacco.  Avoid alcohol and drug use. Some medicines that may be harmful to your baby can pass through breast milk. It is important to ask your health care provider before taking any medicine, including all over-the-counter and prescription medicine as well as vitamin and herbal supplements. It is possible to become pregnant while breastfeeding. If birth control is desired, ask your health care provider about options that will be safe for your baby. SEEK MEDICAL CARE IF:   You feel like you want to stop breastfeeding or have become   frustrated with breastfeeding.  You have painful breasts or nipples.  Your nipples are cracked or bleeding.  Your breasts are red, tender, or warm.  You have a swollen area on either breast.  You have a fever or chills.  You have nausea or vomiting.  You have drainage other than breast milk from  your nipples.  Your breasts do not become full before feedings by the fifth day after you give birth.  You feel sad and depressed.  Your baby is too sleepy to eat well.  Your baby is having trouble sleeping.   Your baby is wetting less than 3 diapers in a 24-hour period.  Your baby has less than 3 stools in a 24-hour period.  Your baby's skin or the white part of his or her eyes becomes yellow.   Your baby is not gaining weight by 5 days of age. SEEK IMMEDIATE MEDICAL CARE IF:   Your baby is overly tired (lethargic) and does not want to wake up and feed.  Your baby develops an unexplained fever. Document Released: 07/07/2005 Document Revised: 07/12/2013 Document Reviewed: 12/29/2012 [employer] Patient Information 2015 [employer]. This information is not intended to replace advice given to you by your health care provider. Make sure you discuss any questions you have with your health care provider.  

## 2015-04-16 NOTE — Progress Notes (Signed)
Patient reports no sleep at night due to being so uncomfortable; reports intense contractions at times that feels like labor; also reports itchy white d/c, possibly with an odor as well.  Reviewed tip of week with patient  Moo Zar used for interpreter Patient needs refill on glyburide

## 2015-04-17 ENCOUNTER — Telehealth: Payer: Self-pay | Admitting: General Practice

## 2015-04-17 ENCOUNTER — Ambulatory Visit (INDEPENDENT_AMBULATORY_CARE_PROVIDER_SITE_OTHER): Payer: Medicaid Other | Admitting: *Deleted

## 2015-04-17 DIAGNOSIS — O24919 Unspecified diabetes mellitus in pregnancy, unspecified trimester: Secondary | ICD-10-CM | POA: Diagnosis present

## 2015-04-17 DIAGNOSIS — O24319 Unspecified pre-existing diabetes mellitus in pregnancy, unspecified trimester: Secondary | ICD-10-CM

## 2015-04-17 DIAGNOSIS — N760 Acute vaginitis: Principal | ICD-10-CM

## 2015-04-17 DIAGNOSIS — B9689 Other specified bacterial agents as the cause of diseases classified elsewhere: Secondary | ICD-10-CM

## 2015-04-17 LAB — WET PREP, GENITAL
Trich, Wet Prep: NONE SEEN
Yeast Wet Prep HPF POC: NONE SEEN

## 2015-04-17 MED ORDER — METRONIDAZOLE 500 MG PO TABS: 500.0000 mg | ORAL_TABLET | Freq: Two times a day (BID) | ORAL | Status: DC

## 2015-04-17 NOTE — Telephone Encounter (Signed)
Patient has BV, flagyl to pharmacy per Dr Shawnie Pons. Called patient with [employer] interpreter [phone] and informed her of results and medication sent to pharmacy. Patient verbalized understanding and had no questions

## 2015-04-18 NOTE — Progress Notes (Signed)
9/27 NST reviewed and reactive

## 2015-04-19 ENCOUNTER — Telehealth: Payer: Self-pay | Admitting: *Deleted

## 2015-04-19 NOTE — Addendum Note (Signed)
Addended by: Marchelle Folks L on: 04/19/2015 08:30 AM   Modules accepted: Orders

## 2015-04-19 NOTE — Telephone Encounter (Signed)
[employer] ID # [id number], Contacted patient with interpreter.  Informed patient of appointment on Friday, September 30 @ 0800. Pt verbalizes understanding.

## 2015-04-20 ENCOUNTER — Ambulatory Visit (INDEPENDENT_AMBULATORY_CARE_PROVIDER_SITE_OTHER): Payer: Medicaid Other | Admitting: General Practice

## 2015-04-20 ENCOUNTER — Other Ambulatory Visit: Payer: Medicaid Other

## 2015-04-20 VITALS — BP 102/61 | HR 87

## 2015-04-20 DIAGNOSIS — O09523 Supervision of elderly multigravida, third trimester: Secondary | ICD-10-CM

## 2015-04-20 DIAGNOSIS — O24919 Unspecified diabetes mellitus in pregnancy, unspecified trimester: Secondary | ICD-10-CM | POA: Diagnosis not present

## 2015-04-20 DIAGNOSIS — O24319 Unspecified pre-existing diabetes mellitus in pregnancy, unspecified trimester: Secondary | ICD-10-CM

## 2015-04-20 NOTE — Progress Notes (Signed)
NST reviewed and reactive.  

## 2015-04-20 NOTE — Progress Notes (Signed)
Win present for intepreter

## 2015-04-23 ENCOUNTER — Encounter: Payer: Self-pay | Admitting: Obstetrics and Gynecology

## 2015-04-23 ENCOUNTER — Encounter: Payer: Medicaid Other | Admitting: Obstetrics and Gynecology

## 2015-04-23 ENCOUNTER — Ambulatory Visit (INDEPENDENT_AMBULATORY_CARE_PROVIDER_SITE_OTHER): Payer: Medicaid Other | Admitting: Obstetrics and Gynecology

## 2015-04-23 VITALS — BP 113/77 | HR 99 | Wt 141.9 lb

## 2015-04-23 DIAGNOSIS — Z603 Acculturation difficulty: Secondary | ICD-10-CM

## 2015-04-23 DIAGNOSIS — O24919 Unspecified diabetes mellitus in pregnancy, unspecified trimester: Secondary | ICD-10-CM | POA: Diagnosis not present

## 2015-04-23 DIAGNOSIS — Z8759 Personal history of other complications of pregnancy, childbirth and the puerperium: Secondary | ICD-10-CM

## 2015-04-23 DIAGNOSIS — O24319 Unspecified pre-existing diabetes mellitus in pregnancy, unspecified trimester: Secondary | ICD-10-CM

## 2015-04-23 DIAGNOSIS — O0943 Supervision of pregnancy with grand multiparity, third trimester: Secondary | ICD-10-CM | POA: Diagnosis not present

## 2015-04-23 DIAGNOSIS — O0993 Supervision of high risk pregnancy, unspecified, third trimester: Secondary | ICD-10-CM | POA: Diagnosis present

## 2015-04-23 LAB — POCT URINALYSIS DIP (DEVICE)
Bilirubin Urine: NEGATIVE
GLUCOSE, UA: NEGATIVE mg/dL
Hgb urine dipstick: NEGATIVE
Ketones, ur: NEGATIVE mg/dL
LEUKOCYTES UA: NEGATIVE
Nitrite: NEGATIVE
Protein, ur: NEGATIVE mg/dL
Specific Gravity, Urine: 1.02 (ref 1.005–1.030)
UROBILINOGEN UA: 0.2 mg/dL (ref 0.0–1.0)
pH: 6.5 (ref 5.0–8.0)

## 2015-04-23 NOTE — Progress Notes (Signed)
Subjective:  Karema Varnadore is a 34 y.o. Z6X0960 at [redacted]w[redacted]d being seen today for ongoing prenatal care.  Patient reports no complaints.  Contractions: Not present.  Vag. Bleeding: None. Movement: Present. Denies leaking of fluid.   The following portions of the patient's history were reviewed and updated as appropriate: allergies, current medications, past family history, past medical history, past social history, past surgical history and problem list.   Objective:   Filed Vitals:   04/23/15 1000  BP: 113/77  Pulse: 99  Weight: 141 lb 14.4 oz (64.365 kg)    Fetal Status: Fetal Heart Rate (bpm): 140   Movement: Present     General:  Alert, oriented and cooperative. Patient is in no acute distress.  Skin: Skin is warm and dry. No rash noted.   Cardiovascular: Normal heart rate noted  Respiratory: Normal respiratory effort, no problems with respiration noted  Abdomen: Soft, gravid, appropriate for gestational age. Pain/Pressure: Present     Pelvic: Vag. Bleeding: None     Cervical exam deferred        Extremities: Normal range of motion.  Edema: None  Mental Status: Normal mood and affect. Normal behavior. Normal judgment and thought content.   Urinalysis:      Assessment and Plan:  Pregnancy: A5W0981 at [redacted]w[redacted]d  1. Supervision of high risk pregnancy, antepartum, third trimester Patient is doing well  2. Pre-existing diabetes mellitus affecting pregnancy, antepartum CBGs reviewed and majority within range except for 1 day where all values are elevated. Patient admitted to being non compliant with diet that day Follow up growth ultrasound 10/5 NST reviewed and reactive  3. Low birth weight   4. Language barrier, cultural differences Interpreter present  5. History of shoulder dystocia in prior pregnancy   6. Grand multiparity with current pregnancy, antepartum, third trimester   Preterm labor symptoms and general obstetric precautions including but not limited to vaginal bleeding,  contractions, leaking of fluid and fetal movement were reviewed in detail with the patient. Please refer to After Visit Summary for other counseling recommendations.  Return in about 1 week (around 04/30/2015).   Catalina Antigua, MD

## 2015-04-25 ENCOUNTER — Ambulatory Visit (HOSPITAL_COMMUNITY)
Admission: RE | Admit: 2015-04-25 | Discharge: 2015-04-25 | Disposition: A | Discharge status: Home / Self Care | Payer: Medicaid Other | Source: Ambulatory Visit | Attending: Obstetrics and Gynecology | Admitting: Obstetrics and Gynecology

## 2015-04-25 ENCOUNTER — Ambulatory Visit (HOSPITAL_COMMUNITY): Admission: RE | Admit: 2015-04-25 | Payer: Medicaid Other | Source: Ambulatory Visit

## 2015-04-25 VITALS — BP 102/59 | HR 91 | Wt 146.0 lb

## 2015-04-25 DIAGNOSIS — O24419 Gestational diabetes mellitus in pregnancy, unspecified control: Secondary | ICD-10-CM | POA: Insufficient documentation

## 2015-04-25 DIAGNOSIS — O0943 Supervision of pregnancy with grand multiparity, third trimester: Secondary | ICD-10-CM

## 2015-04-25 DIAGNOSIS — O24319 Unspecified pre-existing diabetes mellitus in pregnancy, unspecified trimester: Secondary | ICD-10-CM

## 2015-04-25 DIAGNOSIS — Z8759 Personal history of other complications of pregnancy, childbirth and the puerperium: Secondary | ICD-10-CM

## 2015-04-25 DIAGNOSIS — O09523 Supervision of elderly multigravida, third trimester: Secondary | ICD-10-CM | POA: Insufficient documentation

## 2015-04-25 NOTE — ED Notes (Signed)
Interpretor provided by language resources.

## 2015-04-26 ENCOUNTER — Ambulatory Visit (INDEPENDENT_AMBULATORY_CARE_PROVIDER_SITE_OTHER): Payer: Medicaid Other | Admitting: General Practice

## 2015-04-26 VITALS — BP 107/63 | HR 91

## 2015-04-26 DIAGNOSIS — O24919 Unspecified diabetes mellitus in pregnancy, unspecified trimester: Secondary | ICD-10-CM

## 2015-04-26 DIAGNOSIS — O24319 Unspecified pre-existing diabetes mellitus in pregnancy, unspecified trimester: Secondary | ICD-10-CM

## 2015-04-26 NOTE — Progress Notes (Signed)
NST reactive.

## 2015-04-30 ENCOUNTER — Ambulatory Visit (INDEPENDENT_AMBULATORY_CARE_PROVIDER_SITE_OTHER): Payer: Medicaid Other | Admitting: Obstetrics & Gynecology

## 2015-04-30 ENCOUNTER — Encounter: Payer: Medicaid Other | Admitting: Obstetrics & Gynecology

## 2015-04-30 VITALS — BP 116/69 | HR 86 | Wt 143.3 lb

## 2015-04-30 DIAGNOSIS — O24919 Unspecified diabetes mellitus in pregnancy, unspecified trimester: Secondary | ICD-10-CM | POA: Diagnosis not present

## 2015-04-30 DIAGNOSIS — O24319 Unspecified pre-existing diabetes mellitus in pregnancy, unspecified trimester: Secondary | ICD-10-CM

## 2015-04-30 LAB — POCT URINALYSIS DIP (DEVICE)
BILIRUBIN URINE: NEGATIVE
Glucose, UA: NEGATIVE mg/dL
HGB URINE DIPSTICK: NEGATIVE
Ketones, ur: 40 mg/dL — AB
Leukocytes, UA: NEGATIVE
NITRITE: NEGATIVE
Protein, ur: 30 mg/dL — AB
SPECIFIC GRAVITY, URINE: 1.025 (ref 1.005–1.030)
UROBILINOGEN UA: 0.2 mg/dL (ref 0.0–1.0)
pH: 7 (ref 5.0–8.0)

## 2015-04-30 NOTE — Progress Notes (Signed)
OBF/NST Breastfeeding tip of the week reviewed Interpreter present for encounter,  Win Khine

## 2015-04-30 NOTE — Progress Notes (Signed)
Subjective:  Katherine Roman is a 34 y.o. Z6X0960 at [redacted]w[redacted]d being seen today for ongoing prenatal care.  Patient reports no complaints.  Contractions: Not present.  Vag. Bleeding: None. Movement: Present. Denies leaking of fluid.   The following portions of the patient's history were reviewed and updated as appropriate: allergies, current medications, past family history, past medical history, past social history, past surgical history and problem list.   Objective:   Filed Vitals:   04/30/15 1142  BP: 116/69  Pulse: 86  Weight: 143 lb 4.8 oz (65 kg)    Fetal Status: Fetal Heart Rate (bpm): NST   Movement: Present     General:  Alert, oriented and cooperative. Patient is in no acute distress.  Skin: Skin is warm and dry. No rash noted.   Cardiovascular: Normal heart rate noted  Respiratory: Normal respiratory effort, no problems with respiration noted  Abdomen: Soft, gravid, appropriate for gestational age. Pain/Pressure: Present     Pelvic: Vag. Bleeding: None     Cervical exam deferred        Extremities: Normal range of motion.  Edema: Trace  Mental Status: Normal mood and affect. Normal behavior. Normal judgment and thought content.   Urinalysis: Urine Protein: 1+ Urine Glucose: Negative  Assessment and Plan:  Pregnancy: A5W0981 at [redacted]w[redacted]d  1. Pre-existing diabetes mellitus affecting pregnancy, antepartum FBS<90% in range as are PP, only one 200  - Fetal nonstress test Reactive today Preterm labor symptoms and general obstetric precautions including but not limited to vaginal bleeding, contractions, leaking of fluid and fetal movement were reviewed in detail with the patient. Please refer to After Visit Summary for other counseling recommendations.  Return in about 7 days (around 05/07/2015) for Ob fu and NST @ 1000 (overbook).  Continue glyburide  Adam Phenix, MD

## 2015-05-03 ENCOUNTER — Ambulatory Visit (INDEPENDENT_AMBULATORY_CARE_PROVIDER_SITE_OTHER): Payer: Medicaid Other | Admitting: *Deleted

## 2015-05-03 VITALS — BP 105/69 | HR 91

## 2015-05-03 DIAGNOSIS — O24919 Unspecified diabetes mellitus in pregnancy, unspecified trimester: Secondary | ICD-10-CM | POA: Diagnosis present

## 2015-05-03 DIAGNOSIS — O24319 Unspecified pre-existing diabetes mellitus in pregnancy, unspecified trimester: Secondary | ICD-10-CM

## 2015-05-03 NOTE — Progress Notes (Signed)
Interpreter Win Khine present for encounter.  

## 2015-05-06 NOTE — Progress Notes (Signed)
NST performed today was reviewed and was found to be reactive.  AFI normal at 16.6 cm.  Continue recommended antenatal testing and prenatal care. 

## 2015-05-07 ENCOUNTER — Ambulatory Visit (INDEPENDENT_AMBULATORY_CARE_PROVIDER_SITE_OTHER): Payer: Medicaid Other | Admitting: Obstetrics & Gynecology

## 2015-05-07 VITALS — BP 103/66 | HR 101 | Temp 98.6°F | Wt 145.0 lb

## 2015-05-07 DIAGNOSIS — Z8759 Personal history of other complications of pregnancy, childbirth and the puerperium: Secondary | ICD-10-CM | POA: Diagnosis not present

## 2015-05-07 DIAGNOSIS — O09523 Supervision of elderly multigravida, third trimester: Secondary | ICD-10-CM | POA: Diagnosis not present

## 2015-05-07 DIAGNOSIS — O24919 Unspecified diabetes mellitus in pregnancy, unspecified trimester: Secondary | ICD-10-CM

## 2015-05-07 DIAGNOSIS — O24319 Unspecified pre-existing diabetes mellitus in pregnancy, unspecified trimester: Secondary | ICD-10-CM

## 2015-05-07 DIAGNOSIS — O0993 Supervision of high risk pregnancy, unspecified, third trimester: Secondary | ICD-10-CM

## 2015-05-07 LAB — POCT URINALYSIS DIP (DEVICE)
BILIRUBIN URINE: NEGATIVE
Glucose, UA: NEGATIVE mg/dL
Hgb urine dipstick: NEGATIVE
Ketones, ur: NEGATIVE mg/dL
NITRITE: NEGATIVE
PROTEIN: NEGATIVE mg/dL
Specific Gravity, Urine: 1.015 (ref 1.005–1.030)
Urobilinogen, UA: 0.2 mg/dL (ref 0.0–1.0)
pH: 6 (ref 5.0–8.0)

## 2015-05-07 LAB — OB RESULTS CONSOLE GBS: STREP GROUP B AG: NEGATIVE

## 2015-05-07 NOTE — Progress Notes (Signed)
Interpreter Moo Zar present for encounter. Pt is aware of UC's during NST - advised to return to hospital if they become stronger and closer together.

## 2015-05-08 NOTE — Progress Notes (Signed)
Subjective:  Katherine Roman is a 34 y.o. W2N5621G9P8008 at 653w6d being seen today for ongoing prenatal care.  Patient reports no complaints.  Contractions: Irregular.  Vag. Bleeding: None. Movement: Present. Denies leaking of fluid.   The following portions of the patient's history were reviewed and updated as appropriate: allergies, current medications, past family history, past medical history, past social history, past surgical history and problem list. Problem list updated.  Objective:   Filed Vitals:   05/07/15 1043  BP: 103/66  Pulse: 101  Temp: 98.6 F (37 C)  Weight: 145 lb (65.772 kg)    Fetal Status: Fetal Heart Rate (bpm): 138   Movement: Present     General:  Alert, oriented and cooperative. Patient is in no acute distress.  Skin: Skin is warm and dry. No rash noted.   Cardiovascular: Normal heart rate noted  Respiratory: Normal respiratory effort, no problems with respiration noted  Abdomen: Soft, gravid, appropriate for gestational age. Pain/Pressure: Present     Pelvic: Vag. Bleeding: None     Cervical exam performed        Extremities: Normal range of motion.  Edema: None  Mental Status: Normal mood and affect. Normal behavior. Normal judgment and thought content.   Urinalysis: Urine Protein: Negative Urine Glucose: Negative  Assessment and Plan:  Pregnancy: [id number] at 683w6d  1. AMA (advanced maternal age) multigravida 35+, third trimester - GC/Chlamydia probe amp ([place] - Culture, beta strep (group b only)  2. Supervision of high risk pregnancy, antepartum, third trimester CBGs are all within range--good control.  3. History of shoulder dystocia in prior pregnancy 2  Births ago (see problem list) EFW at 38.5 weeks  4. Pre-existing diabetes mellitus affecting pregnancy, antepartum - Fetal nonstress test  Term labor symptoms and general obstetric precautions including but not limited to vaginal bleeding, contractions, leaking of fluid and fetal  movement were reviewed in detail with the patient. Please refer to After Visit Summary for other counseling recommendations.  Return in about 1 week (around 05/14/2015).  NST reactive and reassuring.  Baseline 120 with accelerations and no decelerations.  Mod variability  Lesly DukesKelly H Huyen Perazzo, MD

## 2015-05-09 LAB — CULTURE, BETA STREP (GROUP B ONLY)

## 2015-05-10 ENCOUNTER — Ambulatory Visit (INDEPENDENT_AMBULATORY_CARE_PROVIDER_SITE_OTHER): Payer: Medicaid Other | Admitting: *Deleted

## 2015-05-10 VITALS — Wt 144.2 lb

## 2015-05-10 DIAGNOSIS — O24919 Unspecified diabetes mellitus in pregnancy, unspecified trimester: Secondary | ICD-10-CM

## 2015-05-10 DIAGNOSIS — O24319 Unspecified pre-existing diabetes mellitus in pregnancy, unspecified trimester: Secondary | ICD-10-CM

## 2015-05-10 NOTE — Progress Notes (Signed)
NST on 10/20 is reactive.

## 2015-05-10 NOTE — Progress Notes (Signed)
NST reactive.

## 2015-05-10 NOTE — Progress Notes (Signed)
Here for NST . Used interpreter Georga BoraLay Sha Mu. States not taking baby aspirin because it makes her stomach hurt. Advised her to take after a meal, she states she did, but stomach still hurts States stopped about 2 months ago . Will discuss with provider at next visit.

## 2015-05-14 ENCOUNTER — Ambulatory Visit (INDEPENDENT_AMBULATORY_CARE_PROVIDER_SITE_OTHER): Payer: Medicaid Other | Admitting: Obstetrics and Gynecology

## 2015-05-14 ENCOUNTER — Encounter: Payer: Self-pay | Admitting: Obstetrics and Gynecology

## 2015-05-14 VITALS — BP 99/66 | HR 89 | Wt 144.6 lb

## 2015-05-14 DIAGNOSIS — Z603 Acculturation difficulty: Secondary | ICD-10-CM

## 2015-05-14 DIAGNOSIS — Z8759 Personal history of other complications of pregnancy, childbirth and the puerperium: Secondary | ICD-10-CM | POA: Diagnosis not present

## 2015-05-14 DIAGNOSIS — O24319 Unspecified pre-existing diabetes mellitus in pregnancy, unspecified trimester: Secondary | ICD-10-CM

## 2015-05-14 DIAGNOSIS — O0993 Supervision of high risk pregnancy, unspecified, third trimester: Secondary | ICD-10-CM | POA: Diagnosis not present

## 2015-05-14 DIAGNOSIS — O09523 Supervision of elderly multigravida, third trimester: Secondary | ICD-10-CM | POA: Diagnosis not present

## 2015-05-14 DIAGNOSIS — O24913 Unspecified diabetes mellitus in pregnancy, third trimester: Secondary | ICD-10-CM

## 2015-05-14 DIAGNOSIS — O0943 Supervision of pregnancy with grand multiparity, third trimester: Secondary | ICD-10-CM

## 2015-05-14 LAB — POCT URINALYSIS DIP (DEVICE)
Bilirubin Urine: NEGATIVE
GLUCOSE, UA: NEGATIVE mg/dL
Hgb urine dipstick: NEGATIVE
KETONES UR: NEGATIVE mg/dL
NITRITE: NEGATIVE
PROTEIN: 30 mg/dL — AB
Specific Gravity, Urine: 1.025 (ref 1.005–1.030)
UROBILINOGEN UA: 0.2 mg/dL (ref 0.0–1.0)
pH: 6 (ref 5.0–8.0)

## 2015-05-14 MED ORDER — GLYBURIDE 2.5 MG PO TABS: ORAL_TABLET | ORAL | Status: DC

## 2015-05-14 NOTE — Progress Notes (Signed)
Subjective:  Katherine Roman is a 34 y.o. [id number] at 3638w5d being seen today for ongoing prenatal care.  Patient reports no complaints.   .   . Movement: Present. Denies leaking of fluid.   The following portions of the patient's history were reviewed and updated as appropriate: allergies, current medications, past family history, past medical history, past social history, past surgical history and problem list. Problem list updated.  Objective:   Filed Vitals:   05/14/15 1004  BP: 99/66  Pulse: 89  Weight: 144 lb 9.6 oz (65.59 kg)    Fetal Status: Fetal Heart Rate (bpm): NST   Movement: Present     General:  Alert, oriented and cooperative. Patient is in no acute distress.  Skin: Skin is warm and dry. No rash noted.   Cardiovascular: Normal heart rate noted  Respiratory: Normal respiratory effort, no problems with respiration noted  Abdomen: Soft, gravid, appropriate for gestational age.       Pelvic:       Cervical exam deferred        Extremities: Normal range of motion.  Edema: None  Mental Status: Normal mood and affect. Normal behavior. Normal judgment and thought content.   Urinalysis:      Assessment and Plan:  Pregnancy: [id number] at [redacted]w[redacted]d  1. Pre-existing diabetes mellitus affecting pregnancy, antepartum CBGs reviewed and all within range with the exception of 2 values after dinner of 132 and 144 (patient admitted to overeating) Continue glyburide Follow up growth ultrasound this week NST reviewed and reactive Will schedule IOL at 39 weeks - glyBURIDE (DIABETA) 2.5 MG tablet; Take 1.5 tablets by mouth in the morning and 1 tablet at night.  Dispense: 90 tablet; Refill: 3  2. Language barrier, cultural differences Burmese interpreter present during this encounter   Term labor symptoms and general obstetric precautions including but not limited to vaginal bleeding, contractions, leaking of fluid and fetal movement were reviewed in detail with the patient. Please refer to After  Visit Summary for other counseling recommendations.  Return in about 1 week (around 05/21/2015).   Catalina AntiguaPeggy Nori Poland, MD

## 2015-05-14 NOTE — Progress Notes (Signed)
Interpreter Moo Zar present for encounter.  US for growth scheduled 10/28

## 2015-05-17 ENCOUNTER — Other Ambulatory Visit: Payer: Medicaid Other

## 2015-05-18 ENCOUNTER — Ambulatory Visit (HOSPITAL_COMMUNITY)
Admission: RE | Admit: 2015-05-18 | Discharge: 2015-05-18 | Disposition: A | Discharge status: Home / Self Care | Payer: Medicaid Other | Source: Ambulatory Visit | Attending: Obstetrics and Gynecology | Admitting: Obstetrics and Gynecology

## 2015-05-18 ENCOUNTER — Encounter (HOSPITAL_COMMUNITY): Payer: Self-pay

## 2015-05-18 DIAGNOSIS — O09523 Supervision of elderly multigravida, third trimester: Secondary | ICD-10-CM | POA: Insufficient documentation

## 2015-05-18 DIAGNOSIS — O3663X0 Maternal care for excessive fetal growth, third trimester, not applicable or unspecified: Secondary | ICD-10-CM | POA: Insufficient documentation

## 2015-05-18 DIAGNOSIS — Z3A35 35 weeks gestation of pregnancy: Secondary | ICD-10-CM | POA: Insufficient documentation

## 2015-05-18 DIAGNOSIS — O24319 Unspecified pre-existing diabetes mellitus in pregnancy, unspecified trimester: Secondary | ICD-10-CM

## 2015-05-18 DIAGNOSIS — O24419 Gestational diabetes mellitus in pregnancy, unspecified control: Secondary | ICD-10-CM | POA: Diagnosis not present

## 2015-05-21 ENCOUNTER — Encounter: Payer: Self-pay | Admitting: Obstetrics and Gynecology

## 2015-05-21 ENCOUNTER — Inpatient Hospital Stay (HOSPITAL_COMMUNITY)
Admission: AD | Admit: 2015-05-21 | Discharge: 2015-05-23 | DRG: 775 | Disposition: A | Discharge status: Home / Self Care | Payer: Medicaid Other | Source: Ambulatory Visit | Attending: Obstetrics and Gynecology | Admitting: Obstetrics and Gynecology

## 2015-05-21 ENCOUNTER — Ambulatory Visit (INDEPENDENT_AMBULATORY_CARE_PROVIDER_SITE_OTHER): Payer: Medicaid Other | Admitting: Obstetrics and Gynecology

## 2015-05-21 ENCOUNTER — Encounter (HOSPITAL_COMMUNITY): Payer: Self-pay | Admitting: *Deleted

## 2015-05-21 VITALS — BP 113/76 | HR 88 | Wt 145.3 lb

## 2015-05-21 DIAGNOSIS — O0943 Supervision of pregnancy with grand multiparity, third trimester: Secondary | ICD-10-CM

## 2015-05-21 DIAGNOSIS — E119 Type 2 diabetes mellitus without complications: Secondary | ICD-10-CM | POA: Diagnosis not present

## 2015-05-21 DIAGNOSIS — O0993 Supervision of high risk pregnancy, unspecified, third trimester: Secondary | ICD-10-CM

## 2015-05-21 DIAGNOSIS — O24319 Unspecified pre-existing diabetes mellitus in pregnancy, unspecified trimester: Secondary | ICD-10-CM

## 2015-05-21 DIAGNOSIS — O2432 Unspecified pre-existing diabetes mellitus in childbirth: Secondary | ICD-10-CM | POA: Diagnosis not present

## 2015-05-21 DIAGNOSIS — Z8759 Personal history of other complications of pregnancy, childbirth and the puerperium: Secondary | ICD-10-CM

## 2015-05-21 DIAGNOSIS — IMO0001 Reserved for inherently not codable concepts without codable children: Secondary | ICD-10-CM

## 2015-05-21 DIAGNOSIS — O24919 Unspecified diabetes mellitus in pregnancy, unspecified trimester: Secondary | ICD-10-CM

## 2015-05-21 DIAGNOSIS — Z8611 Personal history of tuberculosis: Secondary | ICD-10-CM | POA: Diagnosis not present

## 2015-05-21 DIAGNOSIS — Z7984 Long term (current) use of oral hypoglycemic drugs: Secondary | ICD-10-CM

## 2015-05-21 DIAGNOSIS — O24419 Gestational diabetes mellitus in pregnancy, unspecified control: Secondary | ICD-10-CM

## 2015-05-21 DIAGNOSIS — Z3A37 37 weeks gestation of pregnancy: Secondary | ICD-10-CM

## 2015-05-21 DIAGNOSIS — O3663X0 Maternal care for excessive fetal growth, third trimester, not applicable or unspecified: Secondary | ICD-10-CM | POA: Diagnosis present

## 2015-05-21 DIAGNOSIS — O24425 Gestational diabetes mellitus in childbirth, controlled by oral hypoglycemic drugs: Secondary | ICD-10-CM | POA: Diagnosis present

## 2015-05-21 DIAGNOSIS — O24913 Unspecified diabetes mellitus in pregnancy, third trimester: Secondary | ICD-10-CM | POA: Diagnosis present

## 2015-05-21 DIAGNOSIS — O09523 Supervision of elderly multigravida, third trimester: Secondary | ICD-10-CM | POA: Diagnosis not present

## 2015-05-21 DIAGNOSIS — Z9289 Personal history of other medical treatment: Secondary | ICD-10-CM

## 2015-05-21 DIAGNOSIS — Z603 Acculturation difficulty: Secondary | ICD-10-CM

## 2015-05-21 LAB — CBC
HCT: 39.2 % (ref 36.0–46.0)
Hemoglobin: 13.2 g/dL (ref 12.0–15.0)
MCH: 29.2 pg (ref 26.0–34.0)
MCHC: 33.7 g/dL (ref 30.0–36.0)
MCV: 86.7 fL (ref 78.0–100.0)
PLATELETS: 306 10*3/uL (ref 150–400)
RBC: 4.52 MIL/uL (ref 3.87–5.11)
RDW: 20.3 % — ABNORMAL HIGH (ref 11.5–15.5)
WBC: 16.5 10*3/uL — ABNORMAL HIGH (ref 4.0–10.5)

## 2015-05-21 LAB — TYPE AND SCREEN
ABO/RH(D): A POS
Antibody Screen: NEGATIVE

## 2015-05-21 MED ORDER — OXYTOCIN 40 UNITS IN LACTATED RINGERS INFUSION - SIMPLE MED
62.5000 mL/h | INTRAVENOUS | Status: DC
  Administered 2015-05-21: 62.5 mL/h via INTRAVENOUS
  Filled 2015-05-21: qty 1000

## 2015-05-21 MED ORDER — PRENATAL MULTIVITAMIN CH
1.0000 | ORAL_TABLET | Freq: Every day | ORAL | Status: DC
  Administered 2015-05-22 – 2015-05-23 (×2): 1 via ORAL
  Filled 2015-05-21 (×2): qty 1

## 2015-05-21 MED ORDER — ONDANSETRON HCL 4 MG PO TABS: 4.0000 mg | ORAL_TABLET | ORAL | Status: DC | PRN

## 2015-05-21 MED ORDER — OXYTOCIN 40 UNITS IN LACTATED RINGERS INFUSION - SIMPLE MED: 62.5000 mL/h | INTRAVENOUS | Status: DC | PRN

## 2015-05-21 MED ORDER — DIPHENHYDRAMINE HCL 25 MG PO CAPS: 25.0000 mg | ORAL_CAPSULE | Freq: Four times a day (QID) | ORAL | Status: DC | PRN

## 2015-05-21 MED ORDER — LANOLIN HYDROUS EX OINT: TOPICAL_OINTMENT | CUTANEOUS | Status: DC | PRN

## 2015-05-21 MED ORDER — SENNOSIDES-DOCUSATE SODIUM 8.6-50 MG PO TABS
2.0000 | ORAL_TABLET | ORAL | Status: DC
  Administered 2015-05-21 – 2015-05-23 (×2): 2 via ORAL
  Filled 2015-05-21 (×2): qty 2

## 2015-05-21 MED ORDER — IBUPROFEN 600 MG PO TABS
600.0000 mg | ORAL_TABLET | Freq: Four times a day (QID) | ORAL | Status: DC
  Administered 2015-05-21 – 2015-05-23 (×8): 600 mg via ORAL
  Filled 2015-05-21 (×8): qty 1

## 2015-05-21 MED ORDER — ACETAMINOPHEN 325 MG PO TABS: 650.0000 mg | ORAL_TABLET | ORAL | Status: DC | PRN

## 2015-05-21 MED ORDER — OXYCODONE-ACETAMINOPHEN 5-325 MG PO TABS
1.0000 | ORAL_TABLET | Freq: Once | ORAL | Status: AC
Start: 2015-05-21 — End: 2015-05-21
  Administered 2015-05-21: 1 via ORAL
  Filled 2015-05-21: qty 1

## 2015-05-21 MED ORDER — BENZOCAINE-MENTHOL 20-0.5 % EX AERO: 1.0000 "application " | INHALATION_SPRAY | CUTANEOUS | Status: DC | PRN

## 2015-05-21 MED ORDER — CITRIC ACID-SODIUM CITRATE 334-500 MG/5ML PO SOLN: 30.0000 mL | ORAL | Status: DC | PRN

## 2015-05-21 MED ORDER — SIMETHICONE 80 MG PO CHEW: 80.0000 mg | CHEWABLE_TABLET | ORAL | Status: DC | PRN

## 2015-05-21 MED ORDER — INFLUENZA VAC SPLIT QUAD 0.5 ML IM SUSY: 0.5000 mL | PREFILLED_SYRINGE | INTRAMUSCULAR | Status: DC

## 2015-05-21 MED ORDER — WITCH HAZEL-GLYCERIN EX PADS: 1.0000 "application " | MEDICATED_PAD | CUTANEOUS | Status: DC | PRN

## 2015-05-21 MED ORDER — LACTATED RINGERS IV SOLN: INTRAVENOUS | Status: DC

## 2015-05-21 MED ORDER — OXYTOCIN BOLUS FROM INFUSION: 500.0000 mL | INTRAVENOUS | Status: DC

## 2015-05-21 MED ORDER — TETANUS-DIPHTH-ACELL PERTUSSIS 5-2.5-18.5 LF-MCG/0.5 IM SUSP: 0.5000 mL | Freq: Once | INTRAMUSCULAR | Status: DC

## 2015-05-21 MED ORDER — MEASLES, MUMPS & RUBELLA VAC ~~LOC~~ INJ: 0.5000 mL | INJECTION | Freq: Once | SUBCUTANEOUS | Status: DC

## 2015-05-21 MED ORDER — ONDANSETRON HCL 4 MG/2ML IJ SOLN: 4.0000 mg | INTRAMUSCULAR | Status: DC | PRN

## 2015-05-21 MED ORDER — LIDOCAINE HCL (PF) 1 % IJ SOLN
30.0000 mL | INTRAMUSCULAR | Status: DC | PRN
  Filled 2015-05-21: qty 30

## 2015-05-21 MED ORDER — ACETAMINOPHEN 325 MG PO TABS
650.0000 mg | ORAL_TABLET | ORAL | Status: DC | PRN
  Administered 2015-05-21 – 2015-05-22 (×2): 650 mg via ORAL
  Filled 2015-05-21 (×2): qty 2

## 2015-05-21 MED ORDER — ONDANSETRON HCL 4 MG/2ML IJ SOLN: 4.0000 mg | Freq: Four times a day (QID) | INTRAMUSCULAR | Status: DC | PRN

## 2015-05-21 MED ORDER — LACTATED RINGERS IV SOLN: 500.0000 mL | INTRAVENOUS | Status: DC | PRN

## 2015-05-21 MED ORDER — DIBUCAINE 1 % RE OINT: 1.0000 "application " | TOPICAL_OINTMENT | RECTAL | Status: DC | PRN

## 2015-05-21 MED ORDER — ZOLPIDEM TARTRATE 5 MG PO TABS: 5.0000 mg | ORAL_TABLET | Freq: Every evening | ORAL | Status: DC | PRN

## 2015-05-21 MED ORDER — MISOPROSTOL 200 MCG PO TABS
1000.0000 ug | ORAL_TABLET | Freq: Once | ORAL | Status: AC
  Administered 2015-05-21: 1000 ug via RECTAL
  Filled 2015-05-21: qty 5

## 2015-05-21 NOTE — Progress Notes (Signed)
Subjective:  Katherine Roman is a 34 y.o. [id number] at [redacted]w[redacted]d being seen today for ongoing prenatal care.  Patient reports contractions since yesterday.  Contractions: Irregular.  Vag. Bleeding: None. Movement: Present. Denies leaking of fluid.   The following portions of the patient's history were reviewed and updated as appropriate: allergies, current medications, past family history, past medical history, past social history, past surgical history and problem list. Problem list updated.  Objective:   Filed Vitals:   05/21/15 1100  BP: 113/76  Pulse: 88  Weight: 145 lb 4.8 oz (65.908 kg)    Fetal Status: Fetal Heart Rate (bpm): NST   Movement: Present     General:  Alert, oriented and cooperative. Patient is in no acute distress.  Skin: Skin is warm and dry. No rash noted.   Cardiovascular: Normal heart rate noted  Respiratory: Normal respiratory effort, no problems with respiration noted  Abdomen: Soft, gravid, appropriate for gestational age. Pain/Pressure: Present     Pelvic: Vag. Bleeding: None Vag D/C Character: Mucous   Cervical exam performed      5.5/100/-3  Extremities: Normal range of motion.  Edema: None  Mental Status: Normal mood and affect. Normal behavior. Normal judgment and thought content.   Urinalysis:      Assessment and Plan:  Pregnancy: [id number] at 3648w5d  1. Pre-existing diabetes mellitus affecting pregnancy, antepartum CBGs reviewed all values within range with the exception of one pp 141 Continue glyburide EFW 10/28 3696 gm NST reviewed and reactive with contractions q4-6 minutes  2. Supervision of high risk pregnancy, antepartum, third trimester Patient in active labor, sent to L&D MD on call informed  3. Language barrier, cultural differences Interpreter present for the encounter  Term labor symptoms and general obstetric precautions including but not limited to vaginal bleeding, contractions, leaking of fluid and fetal movement were reviewed in detail with  the patient. Please refer to After Visit Summary for other counseling recommendations.  Return in about 1 week (around 05/28/2015).   Catalina AntiguaPeggy Pheonix Wisby, MD

## 2015-05-21 NOTE — Progress Notes (Signed)
Patient's blood sugar 68  

## 2015-05-21 NOTE — H&P (Signed)
LABOR AND DELIVERY ADMISSION HISTORY AND PHYSICAL NOTE  Katherine Roman is a 34 y.o. female [phone] with IUP at [redacted]w[redacted]d by L/22 presenting for contractions. Presented for established prenatal appointment today complaining of contractions . She reports +FMs, No LOF, no VB, no blurry vision, headaches or peripheral edema, and RUQ pain.     Prenatal History/Complications:  Past Medical History: Past Medical History  Diagnosis Date  . Headache(784.0)   . TB (pulmonary tuberculosis) 2011    Pt was treated for 8 months    Past Surgical History: Past Surgical History  Procedure Laterality Date  . No past surgeries      Obstetrical History: OB History    Gravida Para Term Preterm AB TAB SAB Ectopic Multiple Living   0 0     8      Social History: Social History   Social History  . Marital Status: Married    Spouse Name: N/A  . Number of Children: N/A  . Years of Education: N/A   Social History Main Topics  . Smoking status: Never Smoker   . Smokeless tobacco: Never Used  . Alcohol Use: No  . Drug Use: No  . Sexual Activity: Yes   Other Topics Concern  . None   Social History Narrative    Family History: Family History  Problem Relation Age of Onset  . Anesthesia problems Neg Hx     Allergies: No Known Allergies  Prescriptions prior to admission  Medication Sig Dispense Refill Last Dose  . ACCU-CHEK FASTCLIX LANCETS MISC 1 each by Percutaneous route 4 (four) times daily. DX GDM O24.419 for testing 4 times daily - Patient notes now has Medicaid Insurance 100 each 12 Taking  . aspirin EC 81 MG tablet Take 1 tablet (81 mg total) by mouth daily. 90 tablet 3 Taking  . ferrous sulfate (FERROUSUL) 325 (65 FE) MG tablet Take 1 tablet (325 mg total) by mouth 2 (two) times daily. 60 tablet 3 Taking  . glucose blood (ACCU-CHEK AVIVA) test strip Use as instructed 100 each 12 Taking  . glyBURIDE (DIABETA) 2.5 MG tablet Take 1.5 tablets by mouth in the morning and 1 tablet at  night. 90 tablet 3 Taking  . Prenatal Vit-Fe Fumarate-FA (PRENATAL MULTIVITAMIN) TABS Take 1 tablet by mouth daily at 12 noon.   Taking     Review of Systems   All systems reviewed and negative except as stated in HPI  Blood pressure 118/76, pulse 88, temperature 97.7 F (36.5 C), temperature source Oral, resp. rate 20, height  (1.6 m), weight 145 lb (65.772 kg), last menstrual period 08/30/2014, not currently breastfeeding. General appearance: alert, cooperative and appears stated age Lungs: clear to auscultation bilaterally Heart: regular rate and rhythm Abdomen: soft, non-tender; bowel sounds normal Extremities: No calf swelling or tenderness Presentation: cephalic Fetal monitoring: 120/mod/+a/-d Uterine activity: q 3 min      Prenatal labs: ABO, Rh: --/--/A POS (10/31 1200) Antibody: NEG (10/31 1200) Rubella: immune RPR: NON REAC (08/15 1118)  HBsAg: Negative (05/16 0000)  HIV: NONREACTIVE (08/15 1118)  GBS: Negative (10/17 0000)  1 hr Glucola 154 -->94/235/156/115 Genetic screening  none Anatomy US wnl  Prenatal Transfer Tool  Maternal Diabetes: Yes:  Diabetes Type:  Insulin/Medication controlled Genetic Screening: Declined Maternal Ultrasounds/Referrals: Normal Fetal Ultrasounds or other Referrals:  None Maternal Substance Abuse:  No Significant Maternal Medications:  None Significant Maternal Lab Results: Lab values include: Group B Strep negative  Results for orders placed  or performed during the hospital encounter of 05/21/15 (from the past 24 hour(s))  CBC   Collection Time: 05/21/15 12:00 PM  Result Value Ref Range   WBC 16.5 (H) 4.0 - 10.5 K/uL   RBC 4.52 3.87 - 5.11 MIL/uL   Hemoglobin 13.2 12.0 - 15.0 g/dL   HCT 29.539.2 62.136.0 - 30.846.0 %   MCV 86.7 78.0 - 100.0 fL   MCH 29.2 26.0 - 34.0 pg   MCHC 33.7 30.0 - 36.0 g/dL   RDW 65.720.3 (H) 84.611.5 - 96.215.5 %   Platelets 306 150 - 400 K/uL  Type and screen   Collection Time: 05/21/15 12:00 PM  Result Value Ref  Range   ABO/RH(D) A POS    Antibody Screen NEG    Sample Expiration 05/24/2015     Patient Active Problem List   Diagnosis Date Noted  . Active labor at term 05/21/2015  . Grand multiparity with current pregnancy, antepartum 04/16/2015  . Supervision of high risk pregnancy, antepartum 12/25/2014  . Pre-existing diabetes mellitus affecting pregnancy, antepartum 12/25/2014  . AMA (advanced maternal age) multigravida 35+ 12/12/2014  . Positive PPD 12/12/2014  . Low birth weight 12/12/2014  . History of hepatitis 12/12/2014  . History of shoulder dystocia in prior pregnancy 12/12/2014  . Language barrier, cultural differences 05/29/2013    Assessment: Katherine Roman is a 34 y.o. [id number] at 4549w5d here for SOL.   #Labor: admit, expectant #Pain: Non-pharmacologic #FWB: Cat 1 #ID:  gbs neg #MOF: breast #MOC: nexplanon #Circ:  Yes, outpatient #A2GDM: random glucose 60s on admission, will monitor q4, is asymptomatic #Suspected macrosomia: EFW > 90th% @ 37+2, does have hx shoulder dystocia.  #PPD positive: CXR wnl this pregnancy, will plan to refer to HD postpartum for treatment discussion  Katherine Roman 05/21/2015, 1:54 PM

## 2015-05-21 NOTE — Lactation Note (Signed)
This note was copied from the chart of Katherine Roman. Lactation Consultation Note  Patient Name: Katherine Marcheta GrammesMa Kreider Today's Date: 05/21/2015 Reason for consult: Initial assessment Baby is 6 hours old and seen for initial LC assessment. This is mom's 9th child but mom reports that she did not breastfeed the other children (daughter helped interpret) but that she wants to BF this infant. Mom reports that she has BF for 2-7 mins but not very long. Baby was asleep when LC entered. Provided mom with Breastfeeding booklet, BF resources, and feeding log; discussed lactation number & support groups. Encouraged mom to continue to feed on demand at least 8-12x in 24 hours and aim to keep the baby at the breast feeding longer. Mom reported no questions or concerns. [facility]  Maternal Data    Feeding Feeding Type: Breast Fed Length of feed: 2 min (per mom)  LATCH Score/Interventions                      Lactation Tools Discussed/Used WIC Program: Yes   Consult Status Consult Status: Follow-up Date: 05/22/15 Follow-up type: In-patient    Oneal GroutLaura C Oluwademilade Kellett 05/21/2015, 9:46 PM

## 2015-05-21 NOTE — H&P (Signed)
MED STUDENT LABOR ADMISSION HISTORY AND PHYSICAL  Katherine Roman is a 34 y.o. female [id number] with IUP at 5652w5d by LMP presenting for spontaneous onset of labor. She reports +FM, + contractions, No LOF, no VB, no blurry vision, headaches or peripheral edema, and RUQ pain.  She plans on breastfeeding. She requests Nexplanon for birth control.  She has a history of shoulder dystocia in a prior pregnancy.   Dating: By LMP (08/30/14) --->  Estimated Date of Delivery: 06/06/15  Sono:   @[redacted]w[redacted]d , CWD, normal anatomy, 454g, 59%EFW @[redacted]w[redacted]d , CWD, normal anatomy, cephalic presentation, longitudinal lie, 1046g, 76%EFW @[redacted]w[redacted]d , CWD, normal anatomy, cephalic presentation, longitudinal lie, 1876g, 81%EFW @[redacted]w[redacted]d , CWD, normal anatomy, cephalic presentation, longitudinal lie, 3210g, >90%EFW @[redacted]w[redacted]d , CWD, normal anatomy, cephalic presentation, longitudinal lie, 3696g, >90%EFW  Prenatal History/Complications:  Past Medical History: Past Medical History  Diagnosis Date  . Headache(784.0)   . TB (pulmonary tuberculosis) 2011    Pt was treated for 8 months    Past Surgical History: Past Surgical History  Procedure Laterality Date  . No past surgeries      Obstetrical History: OB History    Gravida Para Term Preterm AB TAB SAB Ectopic Multiple Living   9 8 8  0 0     8      Social History: Social History   Social History  . Marital Status: Married    Spouse Name: N/A  . Number of Children: N/A  . Years of Education: N/A   Social History Main Topics  . Smoking status: Never Smoker   . Smokeless tobacco: Never Used  . Alcohol Use: No  . Drug Use: No  . Sexual Activity: Yes   Other Topics Concern  . None   Social History Narrative    Family History: Family History  Problem Relation Age of Onset  . Anesthesia problems Neg Hx     Allergies: No Known Allergies  Prescriptions prior to admission  Medication Sig Dispense Refill Last Dose  . ACCU-CHEK FASTCLIX LANCETS MISC 1 each by  Percutaneous route 4 (four) times daily. DX GDM O24.419 for testing 4 times daily - Patient notes now has Medicaid Insurance 100 each 12 Taking  . aspirin EC 81 MG tablet Take 1 tablet (81 mg total) by mouth daily. 90 tablet 3 Taking  . ferrous sulfate (FERROUSUL) 325 (65 FE) MG tablet Take 1 tablet (325 mg total) by mouth 2 (two) times daily. 60 tablet 3 Taking  . glucose blood (ACCU-CHEK AVIVA) test strip Use as instructed 100 each 12 Taking  . glyBURIDE (DIABETA) 2.5 MG tablet Take 1.5 tablets by mouth in the morning and 1 tablet at night. 90 tablet 3 Taking  . Prenatal Vit-Fe Fumarate-FA (PRENATAL MULTIVITAMIN) TABS Take 1 tablet by mouth daily at 12 noon.   Taking     Review of Systems   All systems reviewed and negative except as stated in HPI  BP 109/59 mmHg  Pulse 83  Temp(Src) 98 F (36.7 C) (Oral)  Resp 20  Ht 5\' 3"  (1.6 m)  Wt 65.772 kg (145 lb)  BMI 25.69 kg/m2  LMP 08/30/2014 (Exact Date) General appearance: alert, cooperative and no distress Lungs: clear to auscultation bilaterally Heart: regular rate and rhythm Abdomen: soft, non-tender, gravid Extremities: no peripheral edema Presentation: cephalic Fetal monitoringBaseline: 120 bpm, Variability: Good {> 6 bpm) and Decelerations: Variable: mild Uterine activityDate/time of onset: 05/20/15 at 10pm  Dilation:  (vaccuum assisted delivery of viable infant female ) Effacement (%): 100 Station: +1 Exam  by:: Dr. Ashok Pall   Prenatal labs: ABO, Rh: --/--/A POS (10/31 1200) Antibody: NEG (10/31 1200) Rubella: Immune RPR: NON REAC (08/15 1118)  HBsAg: Negative (05/16 0000)  HIV: NONREACTIVE (08/15 1118)  GBS: Negative (10/17 0000)  1 hr Glucola 154 ----> 94/235/156/15 HgbA1c: 5.9 Genetic screening: NA Anatomy US: nml  Prenatal Transfer Tool  Maternal Diabetes: Yes:  Diabetes Type:  Insulin/Medication controlled Genetic Screening: Declined Maternal Ultrasounds/Referrals: Normal Fetal Ultrasounds or other Referrals:   None Maternal Substance Abuse:  No Significant Maternal Medications:  Meds include: Other: glyburide, ASA  Significant Maternal Lab Results: Lab values include: Group B Strep negative  Results for orders placed or performed during the hospital encounter of 05/21/15 (from the past 24 hour(s))  CBC   Collection Time: 05/21/15 12:00 PM  Result Value Ref Range   WBC 16.5 (H) 4.0 - 10.5 K/uL   RBC 4.52 3.87 - 5.11 MIL/uL   Hemoglobin 13.2 12.0 - 15.0 g/dL   HCT 40.9 81.1 - 91.4 %   MCV 86.7 78.0 - 100.0 fL   MCH 29.2 26.0 - 34.0 pg   MCHC 33.7 30.0 - 36.0 g/dL   RDW 78.2 (H) 95.6 - 21.3 %   Platelets 306 150 - 400 K/uL  Type and screen   Collection Time: 05/21/15 12:00 PM  Result Value Ref Range   ABO/RH(D) A POS    Antibody Screen NEG    Sample Expiration 05/24/2015     Patient Active Problem List   Diagnosis Date Noted  . Active labor at term 05/21/2015  . Grand multiparity with current pregnancy, antepartum 04/16/2015  . Supervision of high risk pregnancy, antepartum 12/25/2014  . Pre-existing diabetes mellitus affecting pregnancy, antepartum 12/25/2014  . AMA (advanced maternal age) multigravida 35+ 12/12/2014  . Positive PPD 12/12/2014  . Low birth weight 12/12/2014  . History of hepatitis 12/12/2014  . History of shoulder dystocia in prior pregnancy 12/12/2014  . Language barrier, cultural differences 05/29/2013    Assessment: Katherine Roman is a 34 y.o. [id number] at [redacted]w[redacted]d here for spotaneous onset of labor.  #Labor: Admit, expectant #Pain: Does not want epidural, nonpharmacologic management #FWB: Category 1 - FHR 120s, +variability, early decels #ID:  GBS negative #MOF: Breast #MOC: Nexplanon #Circ: Yes, outpatient #GDM: Last dose of glyburide this morning, q4hr glucose monitoring #Positive PPD: Negative CXR, previously treated for 8 months  Gabriel Rung, [facility] MS3

## 2015-05-21 NOTE — Progress Notes (Signed)
Interpreter Win Khine present for encounter.  Pt reports more frequent UC's and decreased FM since yesterday.  US for growth done 10/28.

## 2015-05-22 LAB — RPR: RPR: NONREACTIVE

## 2015-05-22 LAB — GLUCOSE, CAPILLARY
GLUCOSE-CAPILLARY: 120 mg/dL — AB (ref 65–99)
Glucose-Capillary: 68 mg/dL (ref 65–99)

## 2015-05-22 NOTE — Progress Notes (Signed)
Post Partum Day 1  Subjective: no complaints, up ad lib, voiding and tolerating PO  Objective: Blood pressure 94/56, pulse 72, temperature 98.1 F (36.7 C), temperature source Oral, resp. rate 18, height 5\' 3"  (1.6 m), weight 65.772 kg (145 lb), last menstrual period 08/30/2014, unknown if currently breastfeeding.  Physical Exam:  General: alert, cooperative and no distress Lochia: appropriate Uterine Fundus: firm Incision: healing well DVT Evaluation: No evidence of DVT seen on physical exam.   Recent Labs  05/21/15 1200  HGB 13.2  HCT 39.2    Assessment/Plan: Plan for discharge tomorrow and Breastfeeding  Plans outpatient circ Plans Nexplanon   LOS: 1 day   [facility], LLCWILLIAMS,Chanell Nadeau 05/22/2015, 8:03 AM

## 2015-05-22 NOTE — Lactation Note (Signed)
This note was copied from the chart of Katherine Roman. Lactation Consultation Note  Patient Name: Katherine Roman Today's Date: 05/22/2015 Reason for consult: Follow-up assessment Daughter was interpreting by mom's request. Baby was fussing while mom was trying to latch upon entry. Baby had a soiled diaper. After changing baby, he calmed and was able to go to breast easily. Mom tries to lean over baby only achieving a shallow latch. Showed mom how to lean back and bring baby to her. She denies breast or nipple pain at this time and skin appears in tack. She will call for bf help as needed.   Maternal Data Has patient been taught Hand Expression?: Yes  Feeding Feeding Type: Breast Fed Length of feed: 10 min (still going)  LATCH Score/Interventions Latch: Grasps breast easily, tongue down, lips flanged, rhythmical sucking. Intervention(s): Adjust position;Assist with latch  Audible Swallowing: None Intervention(s): Skin to skin Intervention(s): Alternate breast massage  Type of Nipple: Everted at rest and after stimulation  Comfort (Breast/Nipple): Soft / non-tender     Hold (Positioning): Assistance needed to correctly position infant at breast and maintain latch. Intervention(s): Support Pillows;Position options  LATCH Score: 7  Lactation Tools Discussed/Used     Consult Status Consult Status: Follow-up Date: 05/23/15 Follow-up type: In-patient    [name] 05/22/2015, 8:05 PM

## 2015-05-23 DIAGNOSIS — O24419 Gestational diabetes mellitus in pregnancy, unspecified control: Secondary | ICD-10-CM

## 2015-05-23 DIAGNOSIS — O24919 Unspecified diabetes mellitus in pregnancy, unspecified trimester: Secondary | ICD-10-CM

## 2015-05-23 DIAGNOSIS — Z8759 Personal history of other complications of pregnancy, childbirth and the puerperium: Secondary | ICD-10-CM

## 2015-05-23 DIAGNOSIS — Z9289 Personal history of other medical treatment: Secondary | ICD-10-CM

## 2015-05-23 LAB — GLUCOSE, CAPILLARY: Glucose-Capillary: 97 mg/dL (ref 65–99)

## 2015-05-23 MED ORDER — IBUPROFEN 600 MG PO TABS: 600.0000 mg | ORAL_TABLET | Freq: Four times a day (QID) | ORAL | Status: AC

## 2015-05-23 NOTE — Progress Notes (Signed)
Patient Assessment and Discharge teaching completed with [facility] # [phone] from [id number].Patient noticed to have a limp and holding left groin area when ambulating to BR. Pateint stated that she had this pain prior to having the baby and that once she got up walking it seems to get better. When patient comes out of the bathroom she was walking more normally and not holding the left groin area.I ask her if she felt unsteady like she would fall and she replied "No". Interpreters # [phone] used at 1445 for signing of discharge instruction papers and confirming the baby patient process. Motrin given to patient with instructions for use.

## 2015-05-23 NOTE — Discharge Instructions (Signed)

## 2015-05-23 NOTE — Plan of Care (Signed)
Problem: Discharge Progression Outcomes Goal: Discharge plan in place and appropriate Baby made the patient at 1445 pm.

## 2015-05-23 NOTE — Discharge Summary (Signed)
OB Discharge Summary  Patient Name: Katherine Roman DOB: October 27, 1980 MRN: [id number]  Date of admission: 05/21/2015 Delivering MD: Shonna Chock [place]   Date of discharge: 05/23/2015  Admitting diagnosis: LABOR Intrauterine pregnancy: [redacted]w[redacted]d     Secondary diagnosis:Active Problems:   Active labor at term   Status post vacuum-assisted vaginal delivery   Diabetes mellitus affecting pregnancy (HCC)  Additional problems: History of positive PPD     Discharge diagnosis: Term Pregnancy Delivered                                                                     Post partum procedures:None  Augmentation: AROM  Complications: None  Hospital course:  Onset of Labor With Vaginal Delivery     34 y.o. yo [phone] at [redacted]w[redacted]d was admitted in Active Labor on 05/21/2015. Patient had an uncomplicated labor course as follows:  Membrane Rupture Time/Date: 1:59 PM ,05/21/2015   Intrapartum Procedures: Episiotomy: None [1]                                         Lacerations:  None [1]  Patient had a delivery of a Viable infant. 05/21/2015  Information for the patient's newborn:  Katherine, Roman [[id number]]  Delivery Method: Vag-Spont     Pateint had an uncomplicated postpartum course.  She is ambulating, tolerating a regular diet, passing flatus, and urinating well. She is complaining of left sided groin pain since late in her pregnancy, which has yet to resolve. Postpartum CBGs <127. Patient is discharged home in stable condition on No discharge date for patient encounter.Marland Kitchen    Physical exam  Filed Vitals:   05/21/15 1730 05/21/15 2055 05/22/15 0536 05/23/15 0553  BP: 97/64  Pulse: 84 82 72 76  Temp: 98.2 F (36.8 C) 98.4 F (36.9 C) 98.1 F (36.7 C) 98.2 F (36.8 C)  TempSrc: Oral   Oral  Resp: Height:      Weight:       General: alert, cooperative and no distress Lochia: appropriate Uterine Fundus: firm Incision: N/A DVT Evaluation: No evidence of DVT  seen on physical exam. Negative Homan's sign. Labs: Lab Results  Component Value Date   WBC 16.5* 05/21/2015   HGB 13.2 05/21/2015   HCT 39.2 05/21/2015   MCV 86.7 05/21/2015   PLT 306 05/21/2015   CMP Latest Ref Rng 01/01/2015  Glucose 70 - 99 mg/dL 95  BUN 6 - 23 mg/dL 8  Creatinine 1.30 - 8.65 mg/dL 7.84  Sodium 696 - 295 mEq/L 136  Potassium 3.5 - 5.3 mEq/L 3.9  Chloride 96 - 112 mEq/L 104  CO2 19 - 32 mEq/L 24  Calcium 8.4 - 10.5 mg/dL 8.4  Total Protein 6.0 - 8.3 g/dL 6.4  Total Bilirubin 0.2 - 1.2 mg/dL 0.2  Alkaline Phos 39 - 117 U/L 59  AST 0 - 37 U/L 12  ALT 0 - 35 U/L <8    Discharge instruction: per After Visit Summary and "Baby and Me Booklet".  Medications: After Visit Meds:    Medication List    STOP taking these  medications        ACCU-CHEK FASTCLIX LANCETS Misc     aspirin EC 81 MG tablet     glucose blood test strip  Commonly known as:  ACCU-CHEK AVIVA     glyBURIDE 2.5 MG tablet  Commonly known as:  DIABETA      TAKE these medications        ferrous sulfate 325 (65 FE) MG tablet  Commonly known as:  FERROUSUL  Take 1 tablet (325 mg total) by mouth 2 (two) times daily.     ibuprofen 600 MG tablet  Commonly known as:  ADVIL,MOTRIN  Take 1 tablet (600 mg total) by mouth every 6 (six) hours.     prenatal multivitamin Tabs tablet  Take 1 tablet by mouth daily at 12 noon.        Diet: routine diet  Activity: Advance as tolerated. Pelvic rest for 6 weeks.   Outpatient follow up:6 weeks Follow up Appt: Would recommend follow-up with HD regarding +PPD.  Future Appointments Date Time Provider Department Center  06/25/2015 1:00 PM Marny LowensteinJulie N Wenzel, PA-C [facility]   Follow up visit: No Follow-up on file.  Postpartum contraception: Nexplanon  Newborn Data: Live born female  Birth Weight: 8 lb 2.2 oz (3690 g) APGAR: 9, 9  Baby Feeding: Breast Disposition:home with mother   05/23/2015 Katherine HaringHillary M Fitzgerald, MD  [facility], PGY[facility]   Language line used Seen and examined by me also Agree with note Tolerating food/fluids Fundus firm Lochia WNL Ext WNL Breastfeeding well Discharge home Aviva SignsMarie L Roman, CNM   Attestation of Attending Supervision of Advanced Practitioner (CNM/NP): Evaluation and management procedures were performed by the Advanced Practitioner under my supervision and collaboration. I have reviewed the Advanced Practitioner's note and chart, and I agree with the management and plan.  Katherine DukesLEGGETT,Bentleigh Waren H., MD 7:58 AM

## 2015-05-23 NOTE — Lactation Note (Signed)
This note was copied from the chart of Katherine Cambry Hageman. Lactation Consultation Note  Patient Name: Katherine Roman Today's Date: 05/23/2015 Reason for consult: Follow-up assessment - [employer] Interpreter - Burmese # [phone] Hendricks Limes/phone line  Pecola LeisureBaby is 45 hours old , and has been breast feeding consistently ,  Voids and stools adequate for age. Bili elevated requiring Double Photo Per MBU RN Lafonda Mossesonna Wear, hasn't started yet.  @ consult baby rooting , large transitional stool changed by LC.  Mom latched the baby independently without pillows, baby noted to have poor postioning .  LC added one pillow ( explaining to mom why it is so helpful to prevent soreness, and enhance flow to baby )  Multiply swallows noted , increased with breast compressions , gulps noted . Moms breast are filling.  Mom repeated demo of breast compressions. Per mom per interpreter the nipple discomfort disappeared when pillow added.  Baby fed 10 mins , took a break for 2 mins and relatched . Still feeding when consult ended.  LC explained to mom ,dad, and daughters what jaundice is and that the baby will be tx. Mom familiar with tx due to one other infant  Having to be tx for 2 days.  Per interpreter per mom no questions regarding breast feeding.   Daughter will be helping mom in the room ( speaks English well ) . LC showed daughter how to update on feeding log sheet for Epic.     Maternal Data Has patient been taught Hand Expression?: Yes  Feeding Feeding Type: Breast Fed Length of feed:  (mom latched baby , kept support under baby, mult swallows )  LATCH Score/Interventions Latch: Grasps breast easily, tongue down, lips flanged, rhythmical sucking. Intervention(s): Breast compression  Audible Swallowing: Spontaneous and intermittent  Type of Nipple: Everted at rest and after stimulation  Comfort (Breast/Nipple): Soft / non-tender     Hold (Positioning): No assistance needed to correctly position infant at  breast. Intervention(s): Breastfeeding basics reviewed;Support Pillows;Position options;Skin to skin  LATCH Score: 10  Lactation Tools Discussed/Used     Consult Status Consult Status: Follow-up Date: 05/24/15 Follow-up type: In-patient    Katherine Roman, Katherine Roman 05/23/2015, 12:00 PM

## 2015-05-24 ENCOUNTER — Ambulatory Visit: Payer: Self-pay

## 2015-05-24 NOTE — Lactation Note (Addendum)
This note was copied from the chart of Katherine Roman. Lactation Consultation Note  Patient Name: Katherine Marcheta GrammesMa Muhl Today's Date: 05/24/2015 - Daughter signed the form to be interpreter per Threasa AlphaKelly Black , MBU RN Daughter present.  Reason for consult: Follow-up assessment;Hyperbilirubinemia (4% weight loss, Bili today - 12.1 ) Baby Patient due to elevated bili , still up today and is on Double Photo.  Voids and stools adequate and stools are transitioning . @ consult LC assisted mom to position baby better to keep the lights on.  Lying position works well for this mom and baby. Deep latch obtained and depth, increased with swallows with breast compressions.  Baby consistently going to the breast, milk is in but mom isn't engorged.  MBU RN aware this mom may need assist with lights and watched for engorgement.     Maternal Data Has patient been taught Hand Expression?: Yes  Feeding Feeding Type: Breast Fed Length of feed:  (still feeding at 12  mins ) ( baby ended up feeding 25 mins )   LATCH Score/Interventions Latch: Grasps breast easily, tongue down, lips flanged, rhythmical sucking. Intervention(s): Adjust position;Assist with latch;Breast massage;Breast compression  Audible Swallowing: Spontaneous and intermittent Intervention(s): Skin to skin;Hand expression Intervention(s): Skin to skin;Hand expression  Type of Nipple: Everted at rest and after stimulation  Comfort (Breast/Nipple): Filling, red/small blisters or bruises, mild/mod discomfort  Problem noted: Filling (milk is in , not engorged ) Interventions (Mild/moderate discomfort): Comfort gels  Hold (Positioning): Assistance needed to correctly position infant at breast and maintain latch. (worked on positioning and depth ) Intervention(s): Breastfeeding basics reviewed;Support Pillows;Position options;Skin to skin  LATCH Score: 8  Lactation Tools Discussed/Used     Consult Status Consult Status: Follow-up Date:  05/25/15 Follow-up type: In-patient    Kathrin Greathouseorio, Sallye Lunz Ann 05/24/2015, 11:33 AM

## 2015-05-25 ENCOUNTER — Other Ambulatory Visit: Payer: Medicaid Other

## 2015-05-25 ENCOUNTER — Ambulatory Visit: Payer: Self-pay

## 2015-05-25 NOTE — Lactation Note (Signed)
This note was copied from the chart of Katherine Roman. Lactation Consultation Note  Patient Name: Katherine Roman Today's Date: 05/25/2015 Reason for consult: Follow-up assessment;Hyperbilirubinemia (6 % weight loss )  Baby is 814 days old and still on double photo tx due to bilirubin increasing.  Baby has been exclusively breast feeding since yesterday and also has been cluster feeding.  Mom milk has been in since yesterday . LC checked breast with moms permission and noted  Breast to be full bilaterally , but not engorged. Per mom per daughter her breast are comfortable. Mom is making great efforts to be consistent breast feeding her baby to keep her breast under control  And not engorged.  LC updated feedings for this afternoon.    Maternal Data    Feeding Feeding Type:  (recently fed per daughter per mom ) Length of feed: 5 min (per daughter per mom )  LATCH Score/Interventions                Intervention(s): Breastfeeding basics reviewed (see LC note )     Lactation Tools Discussed/Used     Consult Status Consult Status: Follow-up Date: 05/26/15 Follow-up type: In-patient    Kathrin Greathouseorio, Gennavieve Huq Ann 05/25/2015, 4:28 PM

## 2015-05-26 ENCOUNTER — Ambulatory Visit: Payer: Self-pay

## 2015-05-26 NOTE — Lactation Note (Signed)
This note was copied from the chart of Katherine Roman. Lactation Consultation Note:  Win- Burmese interpreter present for my visit. Experienced BF mom reports she breast fed all her other children for more than 1 year each. Reports some pain with initial latch but then eases off. Mature milk in. RN asking for comfort gels- given with instructions for use. No questions at present.   Patient Name: Katherine MontgomeryBoy [name] NUUVO'ZToday's Date: 05/26/2015 Reason for consult: Follow-up assessment   Maternal Data    Feeding   LATCH Score/Interventions       Type of Nipple: Everted at rest and after stimulation  Comfort (Breast/Nipple): Filling, red/small blisters or bruises, mild/mod discomfort  Problem noted: Mild/Moderate discomfort Interventions (Filling): Firm support Interventions (Mild/moderate discomfort): Comfort gels        Lactation Tools Discussed/Used     Consult Status Consult Status: Complete    [name], [name] 05/26/2015, 11:50 AM

## 2015-06-25 ENCOUNTER — Encounter: Payer: Self-pay | Admitting: Medical

## 2015-06-25 ENCOUNTER — Ambulatory Visit (INDEPENDENT_AMBULATORY_CARE_PROVIDER_SITE_OTHER): Payer: Medicaid Other | Admitting: Medical

## 2015-06-25 VITALS — BP 116/89 | HR 63 | Temp 98.5°F | Wt 129.5 lb

## 2015-06-25 DIAGNOSIS — Z3202 Encounter for pregnancy test, result negative: Secondary | ICD-10-CM

## 2015-06-25 DIAGNOSIS — Z30018 Encounter for initial prescription of other contraceptives: Secondary | ICD-10-CM

## 2015-06-25 DIAGNOSIS — E119 Type 2 diabetes mellitus without complications: Secondary | ICD-10-CM | POA: Insufficient documentation

## 2015-06-25 LAB — POCT PREGNANCY, URINE: PREG TEST UR: NEGATIVE

## 2015-06-25 MED ORDER — ETONOGESTREL 68 MG ~~LOC~~ IMPL
68.0000 mg | DRUG_IMPLANT | Freq: Once | SUBCUTANEOUS | Status: AC
  Administered 2015-06-25: 68 mg via SUBCUTANEOUS

## 2015-06-25 NOTE — Progress Notes (Signed)
Patient ID: Katherine Roman, female   DOB: [birth year], 34 y.o.   MRN: [id number] Subjective:     Katherine Roman is a 34 y.o. female who presents for a postpartum visit. She is 5 weeks postpartum following a spontaneous vaginal delivery. I have fully reviewed the prenatal and intrapartum course. The delivery was at 37.5 gestational weeks. Outcome: spontaneous vaginal delivery. Anesthesia: none. Postpartum course has been unremarkable. Baby's course has been unremarkable. Baby is feeding by breast. Bleeding no bleeding. Bowel function is normal. Bladder function is normal. Patient is not sexually active. Contraception method is: none. Desires Nexplanon. Postpartum depression screening: negative.  [employer] interpreter (Burmese) [phone] used.  The following portions of the patient's history were reviewed and updated as appropriate: allergies, current medications, past family history, past medical history, past social history, past surgical history and problem list.  Review of Systems Pertinent items are noted in HPI.   Objective:    BP 116/89 mmHg  Pulse 63  Temp(Src) 98.5 F (36.9 C) (Oral)  Wt 129 lb 8 oz (58.741 kg)  Breastfeeding? Yes  General:  alert and cooperative   Breasts:  not performed  Lungs: normal effort  Heart:  normal rate  Abdomen: soft, non-tender   Vulva:  not evaluated  Vagina: not evaluated  Cervix:  not evaluated  Corpus: not examined  Adnexa:  not evaluated  Rectal Exam: Not performed.         GYNECOLOGY CLINIC PROCEDURE NOTE  Nexplanon Insertion Procedure Patient was given informed consent, she signed consent form.  Patient does understand that irregular bleeding is a very common side effect of this medication. She was advised to have backup contraception for one week after placement. Pregnancy test in clinic today was negative.  Appropriate time out taken.  Patient's left arm was prepped and draped in the usual sterile fashion.. The ruler used to measure and mark insertion area.   Patient was prepped with alcohol swab and then injected with 3 ml of 1% lidocaine.  She was prepped with betadine, Nexplanon removed from packaging,  Device confirmed in needle, then inserted full length of needle and withdrawn per handbook instructions. Nexplanon was able to palpated in the patient's arm; patient palpated the insert herself. There was minimal blood loss.  Patient insertion site covered with guaze and a pressure bandage to reduce any bruising.  The patient tolerated the procedure well and was given post procedure instructions.   Assessment:     Normal postpartum exam. Pap smear not done at today's visit.  [facility]  Plan:    1. Contraception: Nexplanon 2. Follow up in: 1 year with [facility] for annual exam or sooner as needed.

## 2015-06-25 NOTE — Patient Instructions (Signed)
Etonogestrel implant What is this medicine? ETONOGESTREL (et oh noe JES trel) is a contraceptive (birth control) device. It is used to prevent pregnancy. It can be used for up to 3 years. This medicine may be used for other purposes; ask your health care provider or pharmacist if you have questions. What should I tell my health care provider before I take this medicine? They need to know if you have any of these conditions: -abnormal vaginal bleeding -blood vessel disease or blood clots -cancer of the breast, cervix, or liver -depression -diabetes -gallbladder disease -headaches -heart disease or recent heart attack -high blood pressure -high cholesterol -kidney disease -liver disease -renal disease -seizures -tobacco smoker -an unusual or allergic reaction to etonogestrel, other hormones, anesthetics or antiseptics, medicines, foods, dyes, or preservatives -pregnant or trying to get pregnant -breast-feeding How should I use this medicine? This device is inserted just under the skin on the inner side of your upper arm by a health care professional. Talk to your pediatrician regarding the use of this medicine in children. Special care may be needed. Overdosage: If you think you have taken too much of this medicine contact a poison control center or emergency room at once. NOTE: This medicine is only for you. Do not share this medicine with others. What if I miss a dose? This does not apply. What may interact with this medicine? Do not take this medicine with any of the following medications: -amprenavir -bosentan -fosamprenavir This medicine may also interact with the following medications: -barbiturate medicines for inducing sleep or treating seizures -certain medicines for fungal infections like ketoconazole and itraconazole -griseofulvin -medicines to treat seizures like carbamazepine, felbamate, oxcarbazepine, phenytoin,  topiramate -modafinil -phenylbutazone -rifampin -some medicines to treat HIV infection like atazanavir, indinavir, lopinavir, nelfinavir, tipranavir, ritonavir -St. John's wort This list may not describe all possible interactions. Give your health care provider a list of all the medicines, herbs, non-prescription drugs, or dietary supplements you use. Also tell them if you smoke, drink alcohol, or use illegal drugs. Some items may interact with your medicine. What should I watch for while using this medicine? This product does not protect you against HIV infection (AIDS) or other sexually transmitted diseases. You should be able to feel the implant by pressing your fingertips over the skin where it was inserted. Contact your doctor if you cannot feel the implant, and use a non-hormonal birth control method (such as condoms) until your doctor confirms that the implant is in place. If you feel that the implant may have broken or become bent while in your arm, contact your healthcare provider. What side effects may I notice from receiving this medicine? Side effects that you should report to your doctor or health care professional as soon as possible: -allergic reactions like skin rash, itching or hives, swelling of the face, lips, or tongue -breast lumps -changes in emotions or moods -depressed mood -heavy or prolonged menstrual bleeding -pain, irritation, swelling, or bruising at the insertion site -scar at site of insertion -signs of infection at the insertion site such as fever, and skin redness, pain or discharge -signs of pregnancy -signs and symptoms of a blood clot such as breathing problems; changes in vision; chest pain; severe, sudden headache; pain, swelling, warmth in the leg; trouble speaking; sudden numbness or weakness of the face, arm or leg -signs and symptoms of liver injury like dark yellow or brown urine; general ill feeling or flu-like symptoms; light-colored stools; loss of  appetite; nausea; right upper belly   pain; unusually weak or tired; yellowing of the eyes or skin -unusual vaginal bleeding, discharge -signs and symptoms of a stroke like changes in vision; confusion; trouble speaking or understanding; severe headaches; sudden numbness or weakness of the face, arm or leg; trouble walking; dizziness; loss of balance or coordination Side effects that usually do not require medical attention (Report these to your doctor or health care professional if they continue or are bothersome.): -acne -back pain -breast pain -changes in weight -dizziness -general ill feeling or flu-like symptoms -headache -irregular menstrual bleeding -nausea -sore throat -vaginal irritation or inflammation This list may not describe all possible side effects. Call your doctor for medical advice about side effects. You may report side effects to FDA at 1-800-FDA-1088. Where should I keep my medicine? This drug is given in a hospital or clinic and will not be stored at home. NOTE: This sheet is a summary. It may not cover all possible information. If you have questions about this medicine, talk to your doctor, pharmacist, or health care provider.    2016, Elsevier/Gold Standard. (2014-04-21 14:07:06)  

## 2015-09-11 ENCOUNTER — Encounter: Payer: Self-pay | Admitting: *Deleted

## 2016-01-16 IMAGING — US US MFM OB FOLLOW-UP
2 series · 14 of 28 positions shown · non-contrast
Comparison: none

[Series 1: us mfm ob follow-up · 46 acquisitions, 12 frames shown (1 of 2)]
[im 2/46]
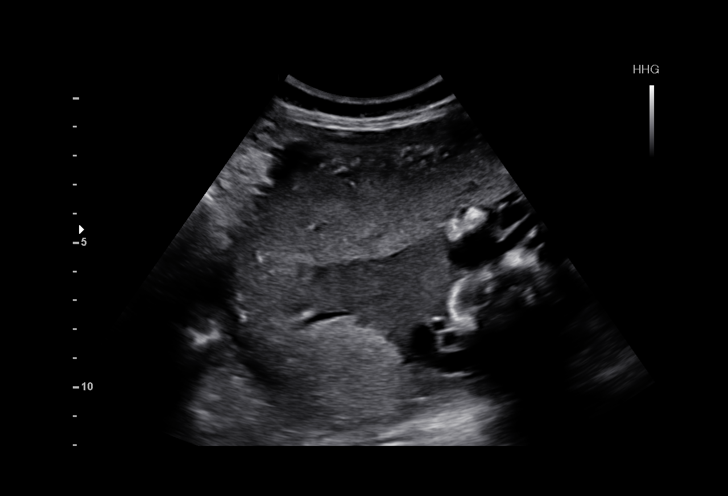
[im 6/46]
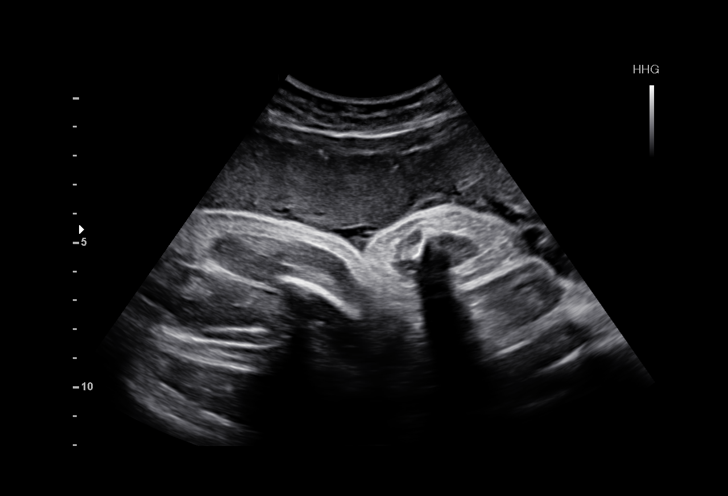
[im 10/46]
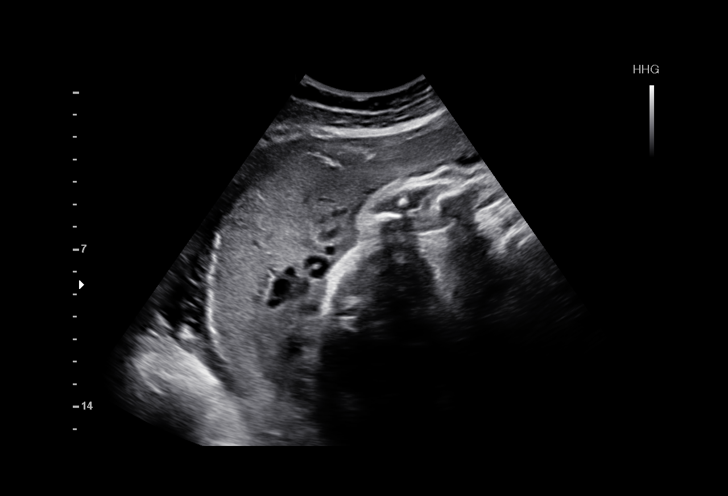
[im 14/46]
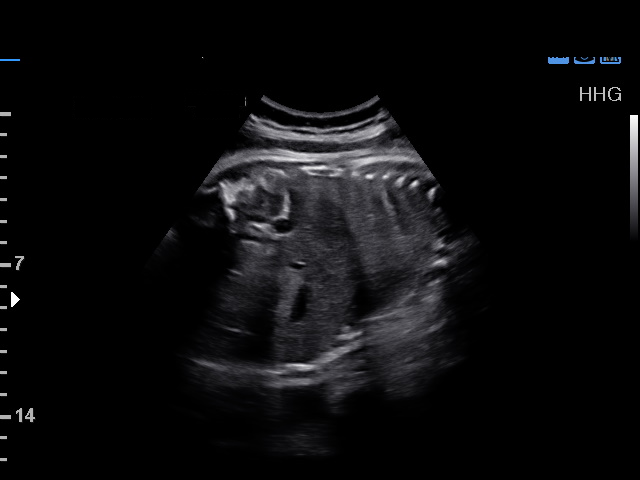
[im 18/46]
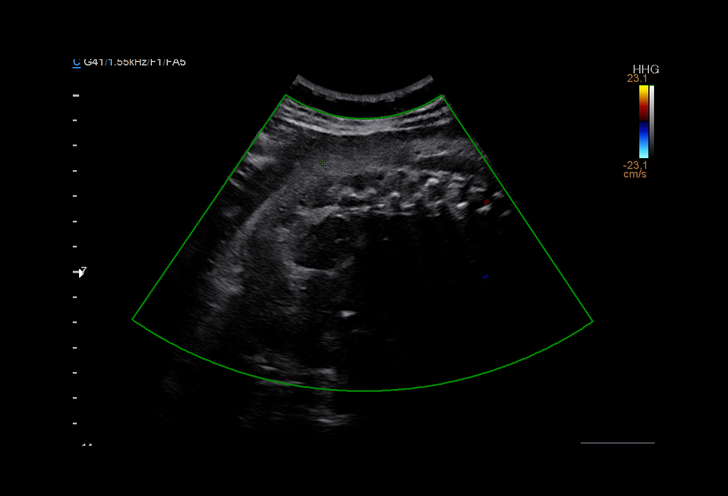
[im 22/46]
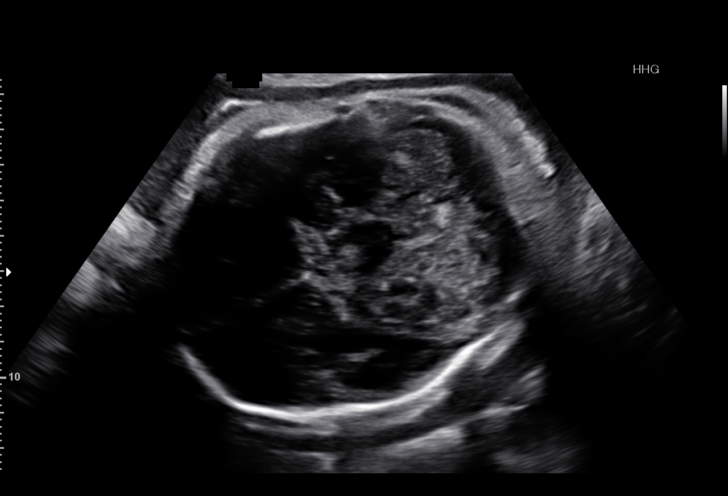
[im 26/46]
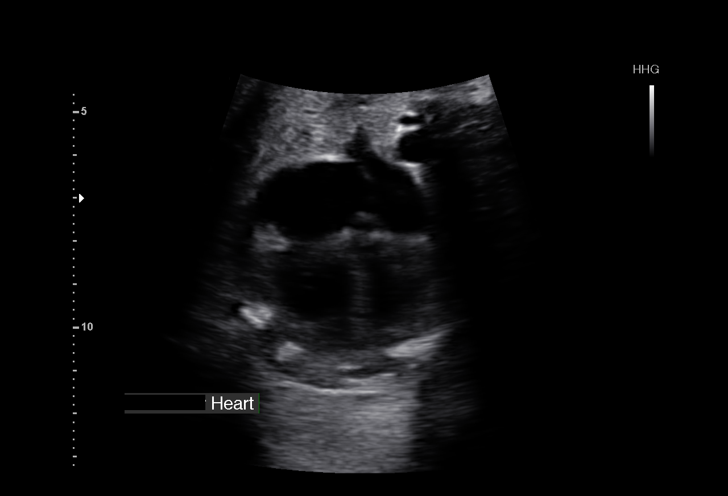
[im 30/46]
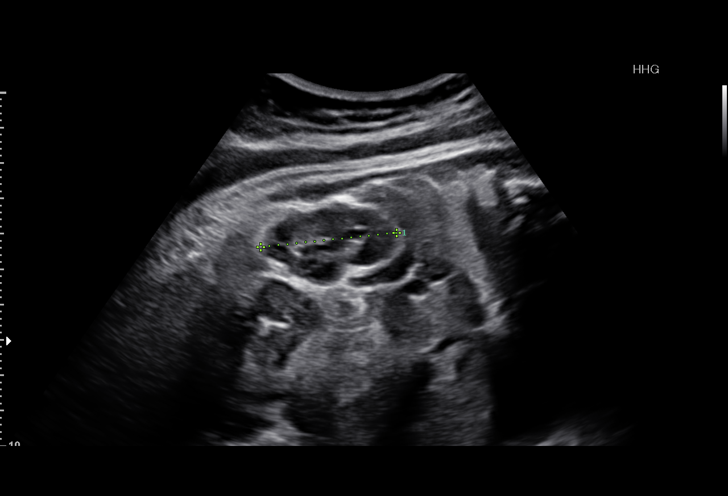
[im 34/46]
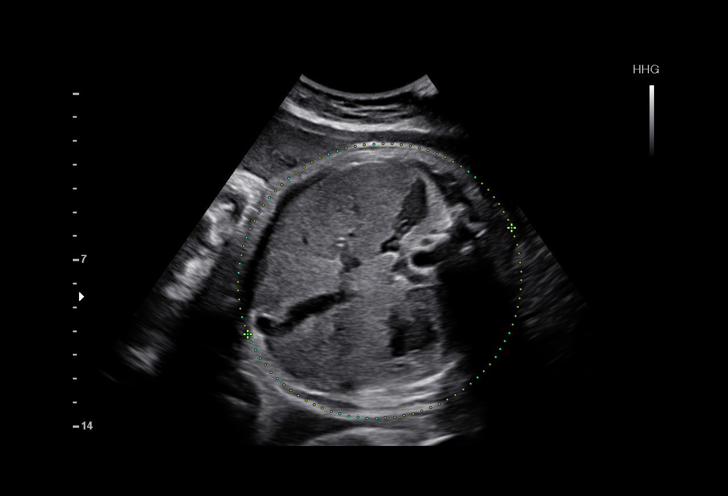
[im 38/46]
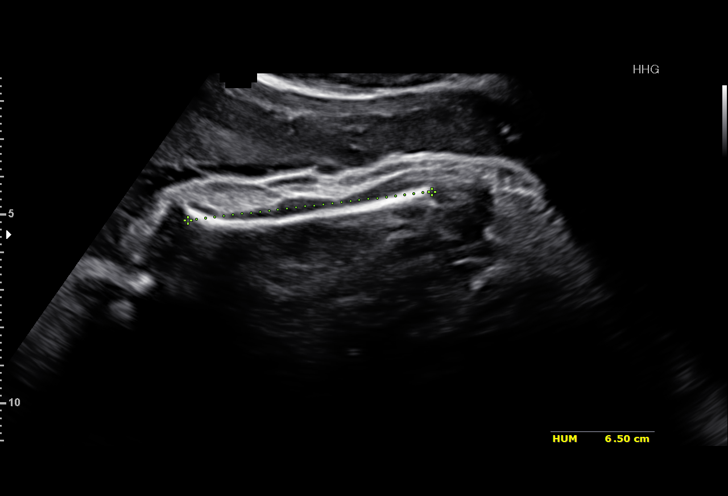
[im 42/46]
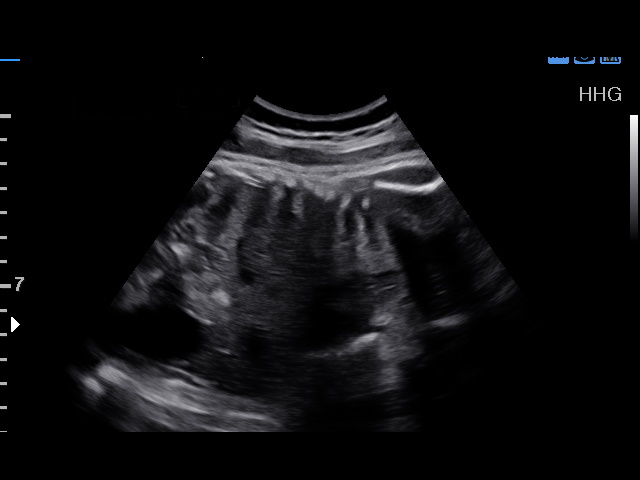
[im 46/46]
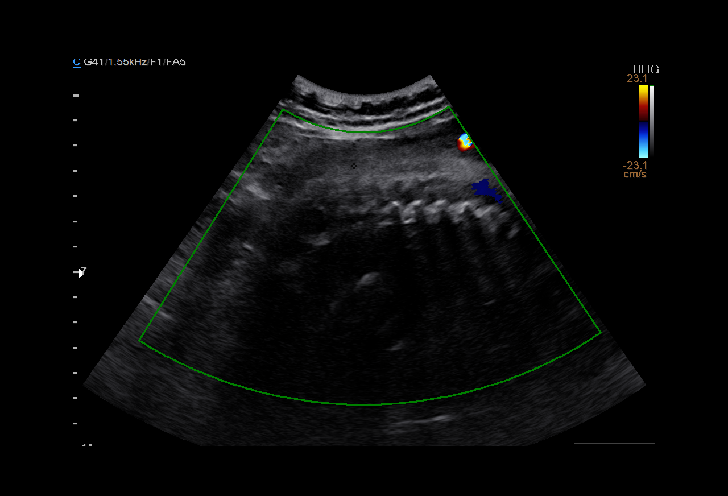

[Series 3: us mfm ob follow-up · 2 of 8 slices shown (2 of 2)]
[im 3/8]
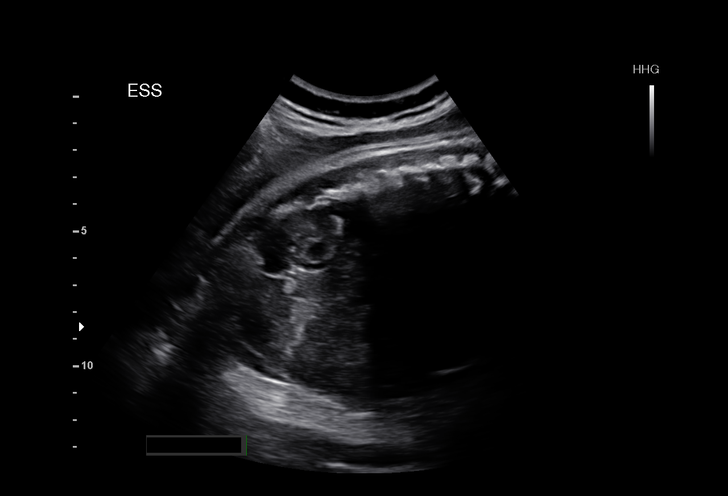
[im 8/8]
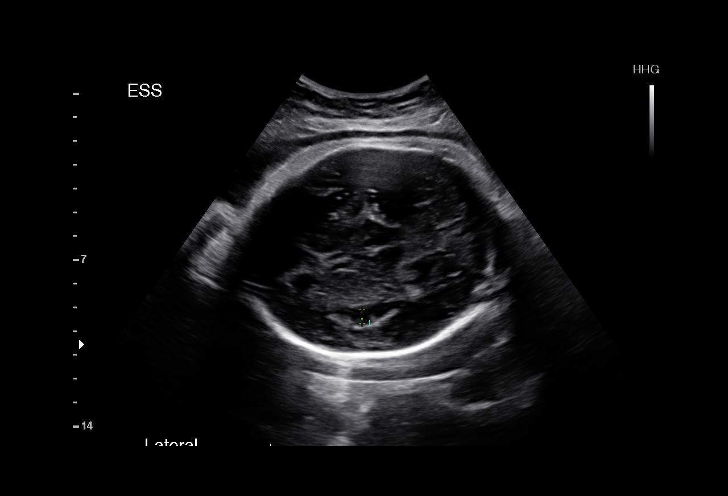

[14 of 28 positions shown; findings below may reference images not displayed]

OBSTETRICS REPORT
(Signed Final 05/18/2015 [DATE])

Date:

Service(s) Provided

Indications

Advanced maternal age multigravida 35+, third
trimester declined MOATSHE and testing
Pre-existing diabetes, type 2, in pregnancy, third
trimester  (on Glyburide)
37 weeks gestation of pregnancy
Fetal Evaluation

Num Of             1
Fetuses:
Fetal Heart        162                          bpm
Rate:
Cardiac Activity:  Observed
Presentation:      Cephalic
Placenta:          Fundal, above cervical os
P. Cord            Previously Visualized
Insertion:

Comment     BPP [DATE]
:

Amniotic Fluid
AFI FV:      Subjectively within normal limits
AFI Sum:     10.85    cm      31  %Tile     Larg Pckt:    5.02   cm
RUQ:   5.02    cm    RLQ:   0       cm   LUQ:    1.98    cm   LLQ:    3.85   cm
Biophysical Evaluation

Amniotic F.V:   Pocket => 2 cm two          F. Tone:        Observed
planes
F. Movement:    Observed                    Score:          [DATE]
F. Breathing:   Observed
Biometry
BPD:     88.4   m    G. Age:   35w 5d                 CI:        72.62   70 - 86
m
FL/HC:      22.6   20.8 -
22.6
HC:     329.9   m    G. Age:   37w 4d        32  %    HC/AC:      0.90   0.92 -
m
AC:     368.1   m    G. Age:   40w 5d      > 97  %    FL/BPD      84.3   71 - 87
m                                     :
FL:      74.5   m    G. Age:   38w 1d        70  %    FL/AC:      20.2   20 - 24
m
HUM:     65.9   m    G. Age:   38w 2d        91  %
m

Est.        1030   gm    8 lb 2 oz    > 90   %
FW:
Gestational Age

LMP:           37w 2d        Date:  08/30/14                  EDD:   06/06/15
U/S Today:     38w 0d                                         EDD:   06/01/15
Best:          37w 2d    Det. By:   LMP  (08/30/14)           EDD:   06/06/15
Anatomy

Cranium:          Appears normal         Aortic Arch:       Previously seen
Fetal Cavum:      Appears normal         Ductal Arch:       Previously seen
Ventricles:       Appears normal         Diaphragm:         Appears normal
Choroid Plexus:   Appears normal         Stomach:           Appears normal,
left sided
Cerebellum:       Previously seen        Abdomen:           Appears normal
Posterior         Previously seen        Abdominal          Previously seen
Fossa:                                   Wall:
Nuchal Fold:      Not applicable (>20    Cord Vessels:      Previously seen
wks GA)
Face:             Orbits and profile     Kidneys:           Appear normal
previously seen
Lips:             Previously seen        Bladder:           Appears normal
Heart:            Appears normal         Spine:             Previously seen
(4CH, axis, and
situs)
RVOT:             Previously seen        Lower              Previously seen
Extremities:
LVOT:             Previously seen        Upper              Previously seen
Extremities:

Other:   Fetus appears to be a male. Heels previously seen. Technically
difficult due to advanced gestational age.
Cervix Uterus Adnexa

Cervix:       Not visualized (advanced GA >29wks)

Left Ovary:    Not visualized.
Right Ovary:   Not visualized.

Adnexa:     No adnexal mass visualized.
Impression

Single IUP at 37w 2d
Type 2 diabetes
The estimated fetal weight is > 90th %tile ( g).  The AC
measures > 97th %tile.
Anterior placenta without previa
Normal amniotic fluid volume
Recommendations

Continue antenatal testing as scheduled.
Recommend delivery at 39 weeks due to type 2 diabetes.

questions or concerns.

## 2017-09-17 ENCOUNTER — Encounter: Payer: Self-pay | Admitting: *Deleted
